# Patient Record
Sex: Female | Born: 1970
Health system: Southern US, Community
[De-identification: ages and names within clinical notes are randomized; demographics above are authoritative.]

## PROBLEM LIST (undated history)

## (undated) DIAGNOSIS — K5792 Diverticulitis of intestine, part unspecified, without perforation or abscess without bleeding: Secondary | ICD-10-CM

## (undated) DIAGNOSIS — K5909 Other constipation: Secondary | ICD-10-CM

## (undated) DIAGNOSIS — Z86018 Personal history of other benign neoplasm: Secondary | ICD-10-CM

## (undated) HISTORY — DX: Other constipation: K59.09

## (undated) HISTORY — DX: Personal history of other benign neoplasm: Z86.018

## (undated) HISTORY — DX: Diverticulitis of intestine, part unspecified, without perforation or abscess without bleeding: K57.92

---

## 2006-09-28 HISTORY — PX: LAPAROSCOPIC SUPRACERVICAL HYSTERECTOMY: SUR797

## 2006-09-28 HISTORY — PX: ABDOMINAL HYSTERECTOMY: SHX81

## 2006-11-24 ENCOUNTER — Emergency Department: Payer: Self-pay | Admitting: Emergency Medicine

## 2006-11-24 ENCOUNTER — Other Ambulatory Visit: Payer: Self-pay

## 2006-11-29 ENCOUNTER — Ambulatory Visit: Payer: Self-pay | Admitting: Emergency Medicine

## 2007-04-18 ENCOUNTER — Ambulatory Visit: Payer: Self-pay | Admitting: Unknown Physician Specialty

## 2007-04-26 ENCOUNTER — Inpatient Hospital Stay: Payer: Self-pay | Admitting: Unknown Physician Specialty

## 2008-11-09 ENCOUNTER — Ambulatory Visit: Payer: Self-pay | Admitting: Family Medicine

## 2008-11-14 ENCOUNTER — Ambulatory Visit: Payer: Self-pay | Admitting: Family Medicine

## 2008-11-22 ENCOUNTER — Ambulatory Visit: Payer: Self-pay | Admitting: Family Medicine

## 2009-07-31 ENCOUNTER — Other Ambulatory Visit: Payer: Self-pay | Admitting: Family Medicine

## 2009-08-28 ENCOUNTER — Ambulatory Visit: Payer: Self-pay | Admitting: Gastroenterology

## 2009-12-29 ENCOUNTER — Emergency Department: Payer: Self-pay | Admitting: Emergency Medicine

## 2012-07-28 ENCOUNTER — Ambulatory Visit: Payer: Self-pay | Admitting: Family Medicine

## 2013-08-02 ENCOUNTER — Ambulatory Visit: Payer: Self-pay | Admitting: Family Medicine

## 2013-08-26 ENCOUNTER — Emergency Department: Payer: Self-pay | Admitting: Emergency Medicine

## 2013-08-26 LAB — COMPREHENSIVE METABOLIC PANEL WITH GFR
Albumin: 3.8 g/dL
Alkaline Phosphatase: 87 U/L
Anion Gap: 4 — ABNORMAL LOW
BUN: 12 mg/dL
Bilirubin,Total: 0.3 mg/dL
Calcium, Total: 8.9 mg/dL
Chloride: 110 mmol/L — ABNORMAL HIGH
Co2: 26 mmol/L
Creatinine: 0.82 mg/dL
EGFR (African American): >60
EGFR (Non-African Amer.): >60
Glucose: 96 mg/dL
Osmolality: 279
Potassium: 3.8 mmol/L
SGOT(AST): 25 U/L
SGPT (ALT): 24 U/L
Sodium: 140 mmol/L
Total Protein: 7.3 g/dL

## 2013-08-26 LAB — CBC WITH DIFFERENTIAL/PLATELET
Basophil #: 0.1 10*3/uL (ref 0.0–0.1)
Eosinophil #: 0.6 10*3/uL (ref 0.0–0.7)
HGB: 13.6 g/dL (ref 12.0–16.0)
Lymphocyte #: 1.8 10*3/uL (ref 1.0–3.6)
Lymphocyte %: 30.9 %
MCH: 29.8 pg (ref 26.0–34.0)
Monocyte #: 0.6 x10 3/mm (ref 0.2–0.9)
Monocyte %: 9.7 %
Neutrophil %: 48.8 %
Platelet: 184 10*3/uL (ref 150–440)

## 2013-08-26 LAB — URINALYSIS, COMPLETE
Bacteria: NONE SEEN
Bilirubin,UR: NEGATIVE
Glucose,UR: NEGATIVE mg/dL
Ketone: NEGATIVE
Leukocyte Esterase: NEGATIVE
Nitrite: NEGATIVE
Ph: 7
Protein: NEGATIVE
RBC,UR: 2 /HPF
Specific Gravity: 1.021
Squamous Epithelial: 1
WBC UR: 1 /HPF

## 2013-08-26 LAB — TROPONIN I: Troponin-I: <0.02 ng/mL

## 2013-08-26 LAB — LIPASE, BLOOD: Lipase: 227 U/L (ref 73–393)

## 2014-06-08 ENCOUNTER — Ambulatory Visit: Payer: Self-pay | Admitting: Family Medicine

## 2014-08-06 ENCOUNTER — Ambulatory Visit: Payer: Self-pay | Admitting: Family Medicine

## 2014-08-06 LAB — HM MAMMOGRAPHY: HM Mammogram: NORMAL

## 2015-01-10 ENCOUNTER — Encounter: Payer: Self-pay | Admitting: General Surgery

## 2015-01-10 ENCOUNTER — Ambulatory Visit (INDEPENDENT_AMBULATORY_CARE_PROVIDER_SITE_OTHER): Payer: 59 | Admitting: General Surgery

## 2015-01-10 VITALS — BP 120/80 | HR 80 | Resp 12 | Ht 67.0 in | Wt 147.0 lb

## 2015-01-10 DIAGNOSIS — N644 Mastodynia: Secondary | ICD-10-CM

## 2015-01-10 NOTE — Progress Notes (Signed)
Patient ID: Donna GreenhouseKimberly Siegman, female   DOB: August 27, 1971, 44 y.o.   MRN: [id number]  Chief Complaint  Patient presents with  . Other    right breast mass    HPI Donna GreenhouseKimberly Economos is a 44 y.o. female. who presents for a breast evaluation. The most recent  mammogram and ultrasound was done on 06/14/14 and in November 2015. Negative finding. Patient states she went for her regular check up and Dr. Carlynn PurlSowles felt a lump om her right breast 12 o'clock. No pain noticed Patient does perform regular self breast checks and gets regular mammograms done.    HPI  Past Medical History  Diagnosis Date  . Diverticulitis     Past Surgical History  Procedure Laterality Date  . Abdominal hysterectomy      History reviewed. No pertinent family history.  Social History History  Substance Use Topics  . Smoking status: Never Smoker   . Smokeless tobacco: Not on file  . Alcohol Use: No    No Known Allergies  No current outpatient prescriptions on file.   No current facility-administered medications for this visit.    Review of Systems Review of Systems  Constitutional: Negative.   Respiratory: Negative.   Cardiovascular: Negative.     Blood pressure 120/80, pulse 80, resp. rate 12, height 5\' 7"  (1.702 m), weight 147 lb (66.679 kg).  Physical Exam Physical Exam  Constitutional: She is oriented to person, place, and time. She appears well-developed and well-nourished.  Eyes: Conjunctivae are normal. No scleral icterus.  Neck: Neck supple.  Cardiovascular: Normal rate, regular rhythm and normal heart sounds.   Pulmonary/Chest: Effort normal and breath sounds normal. Right breast exhibits no inverted nipple, no mass, no nipple discharge, no skin change and no tenderness. Left breast exhibits no inverted nipple, no mass, no nipple discharge, no skin change and no tenderness.  Right breast firmness in the upper outer quadrant. Similar but less marked firmness on left breast uoq.   Abdominal:  Soft. Normal appearance and bowel sounds are normal. There is no hepatomegaly. There is no tenderness.  Lymphadenopathy:    She has no cervical adenopathy.    She has no axillary adenopathy.  Neurological: She is alert and oriented to person, place, and time.  Skin: Skin is warm and dry.    Data Reviewed Mammogram and ultrasound reviewed  Assessment     Findings are benign. Pt reassured.    Plan    Patient to return as needed.  Continue self breast exams. Call office for any new breast issues or concerns. Follow up with PCP for regular mammogram.       PCP:  Charlott HollerSowles, Krichna  SANKAR,SEEPLAPUTHUR G 01/10/2015, 12:06 PM

## 2015-01-10 NOTE — Patient Instructions (Signed)
Patient to return in one  Month. Continue self breast exams. Call office for any new breast issues or concerns.

## 2015-05-16 ENCOUNTER — Telehealth: Payer: Self-pay | Admitting: Family Medicine

## 2015-05-16 NOTE — Telephone Encounter (Signed)
Patient is requesting a emergency prescription of cipro and flagyl. She is currently out of town and is having a diverticulitis flare up. Please send to [facility] on [address] in [place]

## 2015-05-16 NOTE — Telephone Encounter (Signed)
Patient notified

## 2015-05-16 NOTE — Telephone Encounter (Signed)
She needs to go to local urgent care, I am sorry

## 2015-06-17 ENCOUNTER — Encounter: Payer: Self-pay | Admitting: Family Medicine

## 2015-06-17 DIAGNOSIS — R51 Headache: Secondary | ICD-10-CM

## 2015-06-17 DIAGNOSIS — K5909 Other constipation: Secondary | ICD-10-CM | POA: Insufficient documentation

## 2015-06-17 DIAGNOSIS — R519 Headache, unspecified: Secondary | ICD-10-CM | POA: Insufficient documentation

## 2015-06-17 DIAGNOSIS — N63 Unspecified lump in unspecified breast: Secondary | ICD-10-CM | POA: Insufficient documentation

## 2015-06-18 ENCOUNTER — Encounter: Payer: Self-pay | Admitting: Family Medicine

## 2015-06-18 ENCOUNTER — Other Ambulatory Visit: Payer: Self-pay | Admitting: Family Medicine

## 2015-06-18 ENCOUNTER — Ambulatory Visit (INDEPENDENT_AMBULATORY_CARE_PROVIDER_SITE_OTHER): Payer: 59 | Admitting: Family Medicine

## 2015-06-18 VITALS — BP 114/66 | HR 86 | Temp 98.6°F | Resp 14 | Ht 67.0 in | Wt 146.3 lb

## 2015-06-18 DIAGNOSIS — Z23 Encounter for immunization: Secondary | ICD-10-CM | POA: Diagnosis not present

## 2015-06-18 DIAGNOSIS — Z90711 Acquired absence of uterus with remaining cervical stump: Secondary | ICD-10-CM | POA: Insufficient documentation

## 2015-06-18 DIAGNOSIS — Z131 Encounter for screening for diabetes mellitus: Secondary | ICD-10-CM

## 2015-06-18 DIAGNOSIS — Z13220 Encounter for screening for lipoid disorders: Secondary | ICD-10-CM

## 2015-06-18 DIAGNOSIS — Z7189 Other specified counseling: Secondary | ICD-10-CM

## 2015-06-18 DIAGNOSIS — N63 Unspecified lump in unspecified breast: Secondary | ICD-10-CM

## 2015-06-18 DIAGNOSIS — Z90710 Acquired absence of both cervix and uterus: Secondary | ICD-10-CM

## 2015-06-18 DIAGNOSIS — K5790 Diverticulosis of intestine, part unspecified, without perforation or abscess without bleeding: Secondary | ICD-10-CM | POA: Insufficient documentation

## 2015-06-18 DIAGNOSIS — Z1239 Encounter for other screening for malignant neoplasm of breast: Secondary | ICD-10-CM

## 2015-06-18 DIAGNOSIS — Z0000 Encounter for general adult medical examination without abnormal findings: Secondary | ICD-10-CM

## 2015-06-18 DIAGNOSIS — Z01419 Encounter for gynecological examination (general) (routine) without abnormal findings: Secondary | ICD-10-CM

## 2015-06-18 DIAGNOSIS — Z124 Encounter for screening for malignant neoplasm of cervix: Secondary | ICD-10-CM | POA: Diagnosis not present

## 2015-06-18 DIAGNOSIS — Z719 Counseling, unspecified: Secondary | ICD-10-CM

## 2015-06-18 NOTE — Progress Notes (Signed)
Name: Donna Wyatt   MRN: [id number]    DOB: 1971-05-09   Date:06/18/2015       Progress Note  Subjective  Chief Complaint  Chief Complaint  Patient presents with  . Annual Exam    HPI  Well Woman Exam: she has been feeling fine, no complaints.  She has not been physically active lately but eats healthy meals - usually at home.    Patient Active Problem List   Diagnosis Date Noted  . Diverticulosis 06/18/2015  . History of hysterectomy 06/18/2015  . Breast lump in female 06/17/2015  . Chronic constipation 06/17/2015  . Cephalalgia 06/17/2015    Past Surgical History  Procedure Laterality Date  . Abdominal hysterectomy      Family History  Problem Relation Age of Onset  . Diabetes Mother   . Hyperlipidemia Father   . Hypertension Father   . Glaucoma Father   . Hyperlipidemia Sister   . Hypertension Sister   . Asthma Sister   . Fibroids Sister     Social History   Social History  . Marital Status: Married    Spouse Name: N/A  . Number of Children: N/A  . Years of Education: N/A   Occupational History  . Not on file.   Social History Main Topics  . Smoking status: Never Smoker   . Smokeless tobacco: Never Used  . Alcohol Use: 0.0 oz/week    0 Standard drinks or equivalent per week  . Drug Use: No  . Sexual Activity: Yes   Other Topics Concern  . Not on file   Social History Narrative    No current outpatient prescriptions on file.  Allergies  Allergen Reactions  . Flu Virus Vaccine     localized reaction right arm also had N/V and body aches     ROS  Constitutional: Negative for fever or weight change.  Respiratory: Negative for cough and shortness of breath.   Cardiovascular: Negative for chest pain or palpitations.  Gastrointestinal: Negative for abdominal pain, no bowel changes.  Musculoskeletal: Negative for gait problem or joint swelling.  Skin: Negative for rash.  Neurological: Negative for dizziness or headache.  No other  specific complaints in a complete review of systems (except as listed in HPI above).  Objective  Filed Vitals:   06/18/15 0937  BP: 114/66  Pulse: 86  Temp: 98.6 F (37 C)  TempSrc: Oral  Resp: 14  Height:  (1.702 m)  Weight: 146 lb 4.8 oz (66.361 kg)  SpO2: 98%    Body mass index is 22.91 kg/(m^2).  Physical Exam  Constitutional: Patient appears well-developed and well-nourished. No distress.  HENT: Head: Normocephalic and atraumatic. Ears: B TMs ok, no erythema or effusion; Nose: Nose normal. Mouth/Throat: Oropharynx is clear and moist. No oropharyngeal exudate.  Eyes: Conjunctivae and EOM are normal. Pupils are equal, round, and reactive to light. No scleral icterus.  Neck: Normal range of motion. Neck supple. No JVD present. No thyromegaly present.  Cardiovascular: Normal rate, regular rhythm and normal heart sounds.  No murmur heard. No BLE edema. Pulmonary/Chest: Effort normal and breath sounds normal. No respiratory distress. Abdominal: Soft. Bowel sounds are normal, no distension. There is no tenderness. no masses Breast: right breast has a large mass at 12 o'clock - stable and seen by Dr. Evette Cristal - called [facility] and advised to only do  no nipple discharge or rashes FEMALE GENITALIA:   External genitalia normal External urethra normal Vaginal vault normal without discharge or lesions  Cervix normal without discharge or lesions Bimanual exam normal , no uterus, ovaries not felt RECTAL: not done Musculoskeletal: Normal range of motion, no joint effusions. No gross deformities Neurological: he is alert and oriented to person, place, and time. No cranial nerve deficit. Coordination, balance, strength, speech and gait are normal.  Skin: Skin is warm and dry. No rash noted. No erythema.  Psychiatric: Patient has a normal mood and affect. behavior is normal. Judgment and thought content normal.  PHQ2/9: Depression screen PHQ 2/9 06/18/2015  Decreased Interest 0  Down,  Depressed, Hopeless 0  PHQ - 2 Score 0     Fall Risk: Fall Risk  06/18/2015  Falls in the past year? No      Functional Status Survey: Is the patient deaf or have difficulty hearing?: No Does the patient have difficulty seeing, even when wearing glasses/contacts?: No Does the patient have difficulty concentrating, remembering, or making decisions?: No Does the patient have difficulty walking or climbing stairs?: No Does the patient have difficulty dressing or bathing?: No Does the patient have difficulty doing errands alone such as visiting a doctor's office or shopping?: No    Assessment & Plan  1. Well woman exam   2. Needs flu shot  - Flu Vaccine QUAD 36+ mos PF IM (Fluarix & Fluzone Quad PF)  3. Breast lump in female Stable, seen by Dr. Evette Cristal, continue yearly mammogram   4. Health counseling Discussed importance of 150 minutes of physical activity weekly, eat two servings of fish weekly, eat one serving of tree nuts ( cashews, pistachios, pecans, almonds.Marland Kitchen) every other day, eat 6 servings of fruit/vegetables daily and drink plenty of water and avoid sweet beverages.   5. History of hysterectomy Supracervical   6. Lipid screening  - Lipid panel  7. Diabetes mellitus screening  - Glucose  8. Breast cancer screening  - MM Digital Screening; Future

## 2015-06-24 LAB — PAPLB, HPV, RFX16/18: PAP SMEAR COMMENT: 0

## 2015-06-26 NOTE — Progress Notes (Signed)
Patient notified

## 2015-08-28 ENCOUNTER — Other Ambulatory Visit: Payer: Self-pay | Admitting: Family Medicine

## 2015-08-28 ENCOUNTER — Ambulatory Visit
Admission: RE | Admit: 2015-08-28 | Discharge: 2015-08-28 | Disposition: A | Discharge status: Home / Self Care | Payer: 59 | Source: Ambulatory Visit | Attending: Family Medicine | Admitting: Family Medicine

## 2015-08-28 DIAGNOSIS — Z1239 Encounter for other screening for malignant neoplasm of breast: Secondary | ICD-10-CM

## 2015-08-28 DIAGNOSIS — Z1231 Encounter for screening mammogram for malignant neoplasm of breast: Secondary | ICD-10-CM | POA: Diagnosis not present

## 2016-03-26 DIAGNOSIS — Z202 Contact with and (suspected) exposure to infections with a predominantly sexual mode of transmission: Secondary | ICD-10-CM | POA: Diagnosis not present

## 2016-03-26 DIAGNOSIS — N898 Other specified noninflammatory disorders of vagina: Secondary | ICD-10-CM | POA: Diagnosis not present

## 2016-04-29 ENCOUNTER — Ambulatory Visit
Admission: EM | Admit: 2016-04-29 | Discharge: 2016-04-29 | Disposition: A | Discharge status: Home / Self Care | Payer: 59 | Attending: Emergency Medicine | Admitting: Emergency Medicine

## 2016-04-29 ENCOUNTER — Encounter: Payer: Self-pay | Admitting: *Deleted

## 2016-04-29 DIAGNOSIS — K5733 Diverticulitis of large intestine without perforation or abscess with bleeding: Secondary | ICD-10-CM | POA: Diagnosis not present

## 2016-04-29 LAB — BASIC METABOLIC PANEL
Anion gap: 5 (ref 5–15)
BUN: 15 mg/dL (ref 6–20)
CHLORIDE: 108 mmol/L (ref 101–111)
CO2: 25 mmol/L (ref 22–32)
CREATININE: 0.71 mg/dL (ref 0.44–1.00)
Calcium: 9.1 mg/dL (ref 8.9–10.3)
GFR calc Af Amer: >60 mL/min (ref >60)
GFR calc non Af Amer: >60 mL/min (ref >60)
GLUCOSE: 94 mg/dL (ref 65–99)
POTASSIUM: 3.8 mmol/L (ref 3.5–5.1)
Sodium: 138 mmol/L (ref 135–145)

## 2016-04-29 LAB — CBC WITH DIFFERENTIAL/PLATELET
Basophils Absolute: 0.1 10*3/uL (ref 0–0.1)
Basophils Relative: 1 %
EOS ABS: 0.4 10*3/uL (ref 0–0.7)
Eosinophils Relative: 7 %
HCT: 41.2 % (ref 35.0–47.0)
HEMOGLOBIN: 13.6 g/dL (ref 12.0–16.0)
LYMPHS ABS: 2.2 10*3/uL (ref 1.0–3.6)
LYMPHS PCT: 35 %
MCH: 29.2 pg (ref 26.0–34.0)
MCHC: 33 g/dL (ref 32.0–36.0)
MCV: 88.5 fL (ref 80.0–100.0)
MONOS PCT: 8 %
Monocytes Absolute: 0.5 10*3/uL (ref 0.2–0.9)
NEUTROS PCT: 49 %
Neutro Abs: 3.2 10*3/uL (ref 1.4–6.5)
Platelets: 216 10*3/uL (ref 150–440)
RBC: 4.66 MIL/uL (ref 3.80–5.20)
RDW: 12.7 % (ref 11.5–14.5)
WBC: 6.4 10*3/uL (ref 3.6–11.0)

## 2016-04-29 LAB — OCCULT BLOOD X 1 CARD TO LAB, STOOL: Fecal Occult Bld: POSITIVE — AB

## 2016-04-29 MED ORDER — METRONIDAZOLE 500 MG PO TABS: 500.0000 mg | ORAL_TABLET | Freq: Three times a day (TID) | ORAL | 0 refills | Status: DC

## 2016-04-29 MED ORDER — CIPROFLOXACIN HCL 500 MG PO TABS: 500.0000 mg | ORAL_TABLET | Freq: Two times a day (BID) | ORAL | 0 refills | Status: DC

## 2016-04-29 NOTE — ED Notes (Signed)
Rectal exam and sample collected by Dr. Chaney Malling.

## 2016-04-29 NOTE — ED Triage Notes (Signed)
Patient started having bloody stools today. Patient does have a history of diverticulitis.  Patient also states that she has had large stools recently.

## 2016-04-29 NOTE — ED Provider Notes (Signed)
HPI  SUBJECTIVE:  Donna Wyatt is a 45 y.o. female who presents with mild constant, dull left lower quadrant discomfort and blood-streaked stools today. She states that it was bright red. She has had 3 episodes of blood in her stool over the past 3 months. Had one episode today when stooling.  has  had one bowel movement today. She states that previously the blood in her stool was accompanying with left lower quadrant pain which was diagnosed as diverticulitis. She states that today he does not feel like there is a full one flare, but she does have some discomfort in the LLQ. There are no aggravating or alleviating factors. She has not tried anything for this. She denies passing clots, black or tarry stools, melena, frank hematochezia. No dripping of blood into the toilet. She denies fevers, nausea, vomiting, anorexia, chest pain, shortness of breath, lightheadedness, palpitations, presyncope, syncope. She denies abdominal distention, diarrhea, back pain, vaginal bleeding. No urinary complaints, hematuria. No epistaxis. No change in her diet recently. Abdominal pain is not associated with movement, eating, urination, defecation. She has been treated for this in the past as a uncompliocated Diverticulitis with ibuprofen, Cipro and Flagyl. She has never had a colonoscopy. She has a past medical history of diverticulitis usually managed through diet and medical management, and negative for hemorrhoids, anticoagulant antiplatelet use, diabetes, hypertension, afib. No constipation, coagulopathies, thrombocytopenia, NSAID use, peptic ulcer disease, GI bleed, gastric ulcers, ulcerative colitis. Family history negative for GI cancer or colon cancer. PMD: Dr. Carlynn Purl. LMP: Hysterectomy.   Past Medical History:  Diagnosis Date  . Chronic constipation   . Diverticulitis   . History of uterine fibroid     Past Surgical History:  Procedure Laterality Date  . ABDOMINAL HYSTERECTOMY      Family History   Problem Relation Age of Onset  . Diabetes Mother   . Hyperlipidemia Father   . Hypertension Father   . Glaucoma Father   . Hyperlipidemia Sister   . Hypertension Sister   . Asthma Sister   . Fibroids Sister     Social History  Substance Use Topics  . Smoking status: Never Smoker  . Smokeless tobacco: Never Used  . Alcohol use 0.0 oz/week    No current facility-administered medications for this encounter.   Current Outpatient Prescriptions:  .  ciprofloxacin (CIPRO) 500 MG tablet, Take 1 tablet (500 mg total) by mouth 2 (two) times daily., Disp: 14 tablet, Rfl: 0 .  metroNIDAZOLE (FLAGYL) 500 MG tablet, Take 1 tablet (500 mg total) by mouth 3 (three) times daily., Disp: 21 tablet, Rfl: 0  Allergies  Allergen Reactions  . Flu Virus Vaccine     localized reaction right arm also had N/V and body aches     ROS  As noted in HPI.   Physical Exam  BP 136/78 (BP Location: Left Arm)   Pulse 73   Temp 98.5 F (36.9 C) (Oral)   Resp 18   Ht  (1.702 m)   Wt 150 lb (68 kg)   SpO2 100%   BMI 23.49 kg/m   Constitutional: Well developed, well nourished, no acute distressMoving around comfortably Eyes:  EOMI, conjunctiva normal bilaterally HENT: Normocephalic, atraumatic,mucus membranes moist Respiratory: Normal inspiratory effort Cardiovascular: Normal rate GI: nondistended. Normal appearance. Healed transverse lower abdominal surgical scar. Left lower quadrant tenderness with lying down, no rebound or guarding. No left lower quadrant tenderness with patient standing up. No flank tenderness. No suprapubic tenderness. Normal bowel sounds. Negative Eulah Pont,  negative McBurney.  Back: No CVA tenderness Rectal: No fissures, external hemorrhoids. Mucoid pinkish material on the glove. No gross blood or melena. Hemoccult sent. Chaperone present during exam skin: No rash, skin intact Musculoskeletal: no deformities Neurologic: Alert & oriented x 3, no focal neuro  deficits Psychiatric: Speech and behavior appropriate   ED Course   Medications - No data to display  Orders Placed This Encounter  Procedures  . Occult blood card to lab, stool Provider will collect    Standing Status:   Standing    Number of Occurrences:   1    Order Specific Question:   Specimen to be collected by?    Answer:   Provider will collect  . CBC with Differential    Standing Status:   Standing    Number of Occurrences:   1  . Basic metabolic panel    Standing Status:   Standing    Number of Occurrences:   1  . Ambulatory referral to Gastroenterology    Referral Priority:   Urgent    Referral Type:   Consultation    Referral Reason:   Specialty Services Required    Number of Visits Requested:   1    Results for orders placed or performed during the hospital encounter of 04/29/16 (from the past 24 hour(s))  Occult blood card to lab, stool Provider will collect     Status: Abnormal   Collection Time: 04/29/16  5:35 PM  Result Value Ref Range   Fecal Occult Bld POSITIVE (A) NEGATIVE  CBC with Differential     Status: None   Collection Time: 04/29/16  5:50 PM  Result Value Ref Range   WBC 6.4 3.6 - 11.0 K/uL   RBC 4.66 3.80 - 5.20 MIL/uL   Hemoglobin 13.6 12.0 - 16.0 g/dL   HCT 16.1 09.6 - 04.5 %   MCV 88.5 80.0 - 100.0 fL   MCH 29.2 26.0 - 34.0 pg   MCHC 33.0 32.0 - 36.0 g/dL   RDW 40.9 81.1 - 91.4 %   Platelets 216 150 - 440 K/uL   Neutrophils Relative % 49 %   Neutro Abs 3.2 1.4 - 6.5 K/uL   Lymphocytes Relative 35 %   Lymphs Abs 2.2 1.0 - 3.6 K/uL   Monocytes Relative 8 %   Monocytes Absolute 0.5 0.2 - 0.9 K/uL   Eosinophils Relative 7 %   Eosinophils Absolute 0.4 0 - 0.7 K/uL   Basophils Relative 1 %   Basophils Absolute 0.1 0 - 0.1 K/uL  Basic metabolic panel     Status: None   Collection Time: 04/29/16  5:50 PM  Result Value Ref Range   Sodium 138 135 - 145 mmol/L   Potassium 3.8 3.5 - 5.1 mmol/L   Chloride 108 101 - 111 mmol/L   CO2 25 22  - 32 mmol/L   Glucose, Bld 94 65 - 99 mg/dL   BUN 15 6 - 20 mg/dL   Creatinine, Ser 7.82 0.44 - 1.00 mg/dL   Calcium 9.1 8.9 - 95.6 mg/dL   GFR calc non Af Amer >60 >60 mL/min   GFR calc Af Amer >60 >60 mL/min   Anion gap 5 5 - 15   No results found.  ED Clinical Impression  Diverticulitis of large intestine without perforation or abscess with bleeding   ED Assessment/Plan  Reviewed labs independently. hemoocult positive.  Hemoglobin is the same from 2014. No leukocytosis. Normal BMP.  H&P  is most consistent  with uncomplicated diverticulitis. She is nontoxic appearing. Abdomen very benign. No evidence of perforation, obstruction or ischemic colitis. Doubt GU or GYN cause of her symptoms. Normal vitals, normal labs. She does not have any other comorbidities, feel that patient is stable to treat as an outpatient. She states that she will take 800 mg ibuprofen with 1 g of Tylenol 3 times a day, states that she does not need a prescription of IBU. Will send home with Cipro 500 mg twice a day and Flagyl 500 mg every 8 for 10 days. Giving patient strict ER abdominal pain return precautions. Ordering GI follow-up.   Discussed labs,MDM, plan and followup with patient . Discussed sn/sx that should prompt return to the ED. Patient agrees with plan.   *This clinic note was created using Dragon dictation software. Therefore, there may be occasional mistakes despite careful proofreading.  ?   Domenick Gong, MD 04/29/16 [phone]

## 2016-04-29 NOTE — Discharge Instructions (Signed)
800 mg ibuprofen with 1 g of Tylenol 3 times a day. Follow-up with Dr. Marva Panda GI on-call. Go to the ER for the signs and symptoms we discussed

## 2016-04-30 ENCOUNTER — Ambulatory Visit: Payer: 59 | Admitting: Family Medicine

## 2016-07-13 ENCOUNTER — Other Ambulatory Visit: Payer: Self-pay | Admitting: Family Medicine

## 2016-07-28 ENCOUNTER — Encounter: Payer: Self-pay | Admitting: Family Medicine

## 2016-07-28 ENCOUNTER — Ambulatory Visit (INDEPENDENT_AMBULATORY_CARE_PROVIDER_SITE_OTHER): Payer: 59 | Admitting: Family Medicine

## 2016-07-28 VITALS — BP 104/62 | HR 92 | Temp 98.3°F | Resp 18 | Ht 67.0 in | Wt 154.7 lb

## 2016-07-28 DIAGNOSIS — K5730 Diverticulosis of large intestine without perforation or abscess without bleeding: Secondary | ICD-10-CM

## 2016-07-28 DIAGNOSIS — Z8719 Personal history of other diseases of the digestive system: Secondary | ICD-10-CM

## 2016-07-28 DIAGNOSIS — N644 Mastodynia: Secondary | ICD-10-CM

## 2016-07-28 NOTE — Progress Notes (Signed)
Name: Donna Wyatt   MRN: [id number]    DOB: [birth year]   Date:07/28/2016       Progress Note  Subjective  Chief Complaint  Chief Complaint  Patient presents with  . Breast Pain    across right breast intermittent pain for two to three days     HPI  Right breast pain: she noticed right breast pain, described as sharp pain, intermittent that lasted a few days and resolved. She had a diagnostic US and mammogram of right breast in 2015, normal in 2016. She is concerned because she felt a lump on right breast  Diverticulitis: she had 3 severe episodes of diverticulitis, last one was two months ago, she went to Urgent Care and took Cipro and Metronidazole, she had one episode of bloody stools, pain has resolved. She never had a colonoscopy, No longer having problems with constipation   Patient Active Problem List   Diagnosis Date Noted  . Diverticulosis 06/18/2015  . History of hysterectomy, supracervical 06/18/2015  . Breast lump in female 06/17/2015  . Chronic constipation 06/17/2015  . Cephalalgia 06/17/2015    Past Surgical History:  Procedure Laterality Date  . ABDOMINAL HYSTERECTOMY      Family History  Problem Relation Age of Onset  . Diabetes Mother   . Hyperlipidemia Father   . Hypertension Father   . Glaucoma Father   . Hyperlipidemia Sister   . Hypertension Sister   . Asthma Sister   . Fibroids Sister     Social History   Social History  . Marital status: Married    Spouse name: N/A  . Number of children: N/A  . Years of education: N/A   Occupational History  . Not on file.   Social History Main Topics  . Smoking status: Never Smoker  . Smokeless tobacco: Never Used  . Alcohol use 0.0 oz/week  . Drug use: No  . Sexual activity: Yes   Other Topics Concern  . Not on file   Social History Narrative  . No narrative on file    No current outpatient prescriptions on file.  No Known Allergies   ROS  Ten systems reviewed and is negative  except as mentioned in HPI   Objective  Vitals:   07/28/16 0926  BP: 104/62  Pulse: 92  Resp: 18  Temp: 98.3 F (36.8 C)  SpO2: 98%  Weight: 154 lb 11.2 oz (70.2 kg)  Height: _0  (1.702 m)    Body mass index is 24.23 kg/m.  Physical Exam  Constitutional: Patient appears well-developed and well-nourished. No distress.  HEENT: head atraumatic, normocephalic, pupils equal and reactive to light, , neck supple, throat within normal limits Cardiovascular: Normal rate, regular rhythm and normal heart sounds.  No murmur heard. No BLE edema. Pulmonary/Chest: Effort normal and breath sounds normal. No respiratory distress. Abdominal: Soft.  There is no tenderness. Psychiatric: Patient has a normal mood and affect. behavior is normal. Judgment and thought content normal. Breast: right upper breast had a large mass, possible breast lump. No tenderness, normal axillary exam  Recent Results (from the past 2160 hour(s))  Occult blood card to lab, stool Provider will collect     Status: Abnormal   Collection Time: 04/29/16  5:35 PM  Result Value Ref Range   Fecal Occult Bld POSITIVE (A) NEGATIVE  CBC with Differential     Status: None   Collection Time: 04/29/16  5:50 PM  Result Value Ref Range   WBC 6.4 3.6 - 11.0  K/uL   RBC 4.66 3.80 - 5.20 MIL/uL   Hemoglobin 13.6 12.0 - 16.0 g/dL   HCT 41.2 35.0 - 47.0 %   MCV 88.5 80.0 - 100.0 fL   MCH 29.2 26.0 - 34.0 pg   MCHC 33.0 32.0 - 36.0 g/dL   RDW 12.7 11.5 - 14.5 %   Platelets 216 150 - 440 K/uL   Neutrophils Relative % 49 %   Neutro Abs 3.2 1.4 - 6.5 K/uL   Lymphocytes Relative 35 %   Lymphs Abs 2.2 1.0 - 3.6 K/uL   Monocytes Relative 8 %   Monocytes Absolute 0.5 0.2 - 0.9 K/uL   Eosinophils Relative 7 %   Eosinophils Absolute 0.4 0 - 0.7 K/uL   Basophils Relative 1 %   Basophils Absolute 0.1 0 - 0.1 K/uL  Basic metabolic panel     Status: None   Collection Time: 04/29/16  5:50 PM  Result Value Ref Range   Sodium 138 135 -  145 mmol/L   Potassium 3.8 3.5 - 5.1 mmol/L   Chloride 108 101 - 111 mmol/L   CO2 25 22 - 32 mmol/L   Glucose, Bld 94 65 - 99 mg/dL   BUN 15 6 - 20 mg/dL   Creatinine, Ser 0.71 0.44 - 1.00 mg/dL   Calcium 9.1 8.9 - 10.3 mg/dL   GFR calc non Af Amer >60 >60 mL/min   GFR calc Af Amer >60 >60 mL/min    Comment: (NOTE) The eGFR has been calculated using the CKD EPI equation. This calculation has not been validated in all clinical situations. eGFR's persistently <60 mL/min signify possible Chronic Kidney Disease.    Anion gap 5 5 - 15      PHQ2/9: Depression screen [facility] 2/9 07/28/2016 06/18/2015  Decreased Interest 0 0  Down, Depressed, Hopeless 0 0  PHQ - 2 Score 0 0     Fall Risk: Fall Risk  07/28/2016 06/18/2015  Falls in the past year? No No    Functional Status Survey: Is the patient deaf or have difficulty hearing?: No Does the patient have difficulty seeing, even when wearing glasses/contacts?: No Does the patient have difficulty concentrating, remembering, or making decisions?: No Does the patient have difficulty walking or climbing stairs?: No Does the patient have difficulty dressing or bathing?: No Does the patient have difficulty doing errands alone such as visiting a doctor's office or shopping?: No    Assessment & Plan    1. Breast pain in female  - Ambulatory referral to General Surgery - MM Digital Diagnostic Unilat R; Future - MM Digital Diagnostic Bilat; Future - US BREAST COMPLETE UNI RIGHT INC AXILLA; Future  2. Diverticulosis of sigmoid colon  - Ambulatory referral to General Surgery  3. History of diverticulitis  - Ambulatory referral to General Surgery

## 2016-07-28 NOTE — Addendum Note (Signed)
Addended by: Cynda FamiliaJOHNSON, Vernie Vinciguerra L on: 07/28/2016 10:39 AM   Modules accepted: Orders

## 2016-08-06 ENCOUNTER — Encounter: Payer: Self-pay | Admitting: *Deleted

## 2016-08-11 ENCOUNTER — Ambulatory Visit: Payer: 59 | Admitting: General Surgery

## 2016-08-17 ENCOUNTER — Ambulatory Visit: Payer: 59

## 2016-08-17 ENCOUNTER — Other Ambulatory Visit: Payer: 59

## 2016-08-17 LAB — HM MAMMOGRAPHY

## 2016-08-18 ENCOUNTER — Other Ambulatory Visit: Payer: Self-pay | Admitting: Family Medicine

## 2016-08-18 ENCOUNTER — Ambulatory Visit
Admission: RE | Admit: 2016-08-18 | Discharge: 2016-08-18 | Disposition: A | Discharge status: Home / Self Care | Payer: 59 | Source: Ambulatory Visit | Attending: Family Medicine | Admitting: Family Medicine

## 2016-08-18 DIAGNOSIS — N644 Mastodynia: Secondary | ICD-10-CM

## 2016-08-19 ENCOUNTER — Encounter: Payer: Self-pay | Admitting: Family Medicine

## 2016-09-24 ENCOUNTER — Encounter: Payer: Self-pay | Admitting: *Deleted

## 2017-07-19 ENCOUNTER — Other Ambulatory Visit: Payer: Self-pay | Admitting: Family Medicine

## 2017-07-19 DIAGNOSIS — Z1231 Encounter for screening mammogram for malignant neoplasm of breast: Secondary | ICD-10-CM

## 2017-09-01 ENCOUNTER — Ambulatory Visit: Payer: Self-pay | Admitting: *Deleted

## 2017-09-01 ENCOUNTER — Encounter: Payer: Self-pay | Admitting: Family Medicine

## 2017-09-01 ENCOUNTER — Emergency Department: Admission: EM | Admit: 2017-09-01 | Discharge: 2017-09-01 | Discharge status: Against Medical Advice | Payer: 59

## 2017-09-01 ENCOUNTER — Ambulatory Visit (INDEPENDENT_AMBULATORY_CARE_PROVIDER_SITE_OTHER): Payer: 59 | Admitting: Family Medicine

## 2017-09-01 VITALS — BP 118/82 | HR 73 | Temp 97.9°F | Resp 16 | Ht 67.0 in | Wt 156.4 lb

## 2017-09-01 DIAGNOSIS — R1110 Vomiting, unspecified: Secondary | ICD-10-CM

## 2017-09-01 DIAGNOSIS — R197 Diarrhea, unspecified: Secondary | ICD-10-CM

## 2017-09-01 DIAGNOSIS — Z8719 Personal history of other diseases of the digestive system: Secondary | ICD-10-CM

## 2017-09-01 DIAGNOSIS — K921 Melena: Secondary | ICD-10-CM

## 2017-09-01 LAB — HEMOCCULT GUIAC POC 1CARD (OFFICE): FECAL OCCULT BLD: NEGATIVE

## 2017-09-01 NOTE — Progress Notes (Signed)
Name: Amalia GreenhouseKimberly Berent   MRN: [id number]    DOB: [birth year]   Date:09/01/2017       Progress Note  Subjective  Chief Complaint  Chief Complaint  Patient presents with  . GI Problem    patient presents with GI upset.  . Nausea  . Vomiting  . Rectal Bleeding    patient stated that it is bright red blood for about 2 weeks ago that she thought was related to her diverticulitis.    HPI  Patient presents concern for GI bleed:  2 Weeks ago she noticed blood in stool for several days - did not seek medical attention at that time, symptoms improved and blood in stools stopped for a few days.  Last night, she developed significant central abdominal pain (x3 hours), had BM with a normal stool, but with surrounding bright red blood.  A little later she developed nausea (still ongoing), vomiting (Multiple episodes over a 30 minute period last night - no blood or coffee ground emesis).  Has had 1 loose stool this morning at 0500 - did not have blood in this BM.  No known hemorrhoids, no anticoagulation therapy, no NSAID use, has glass of wine about 2 times a week, no recent changes in her diet, no prior abdominal surgeries aside from sigmoidoscopy.   At present she denies abdominal pain, but endorses significant nausea.  Pt has history of diverticulitis - most recent episode was 04/29/16, was treated in the ER with Cipro/Flagyl and improved.  Had flexible sigmoidoscopy in 2010, the area of the colon that was examined was WNL, and notation states for patient to return only PRN.  She states this does not feel like her prior diverticulitis symptoms - as this is usually LLQ pain.  Patient Active Problem List   Diagnosis Date Noted  . Diverticulosis 06/18/2015  . History of hysterectomy, supracervical 06/18/2015  . Breast lump in female 06/17/2015  . Chronic constipation 06/17/2015  . Cephalalgia 06/17/2015    Social History   Tobacco Use  . Smoking status: Never Smoker  . Smokeless tobacco: Never  Used  Substance Use Topics  . Alcohol use: Yes    Alcohol/week: 0.0 oz    No current outpatient medications on file.  No Known Allergies  ROS  Constitutional: Negative for fever or weight change.  Respiratory: Negative for cough and shortness of breath.   Cardiovascular: Negative for chest pain or palpitations.  Gastrointestinal: See HPI; no urinary symptoms Skin: Negative for rash.  Neurological: Negative for dizziness, lightheadedness, or headache.  No other specific complaints in a complete review of systems (except as listed in HPI above).  Objective  Vitals:   09/01/17 0912  BP: 118/82  Pulse: 73  Resp: 16  Temp: 97.9 F (36.6 C)  TempSrc: Oral  SpO2: 99%  Weight: 156 lb 6.4 oz (70.9 kg)  Height: 5\' 7"  (1.702 m)   Body mass index is 24.5 kg/m.  Nursing Note and Vital Signs reviewed.  Physical Exam  Constitutional: Patient appears well-developed and well-nourished.  No distress.  HEENT: head atraumatic, normocephalic Cardiovascular: Normal rate, regular rhythm, S1/S2 present.  No murmur or rub heard. No BLE edema. Pulmonary/Chest: Effort normal and breath sounds clear. No respiratory distress or retractions. Abdominal: Soft and mild tenderness to LLQ, bowel sounds present x4 quadrants.  Psychiatric: Patient has a normal mood and affect. behavior is normal. Judgment and thought content normal.  No results found for this or any previous visit (from the past 2160 hour(s)).  Assessment & Plan  1. Blood in stool, frank - POCT Occult Blood Stool - Negative, though question if adequate sample obtained. - Ambulatory referral to Gastroenterology 2. History of diverticulitis - Ambulatory referral to Gastroenterology 3. Vomiting and diarrhea - POCT Occult Blood Stool  - Discussed patient's case with PCP Dr. Carlynn PurlSowles who recommends pt present for emergency care due to ongoing and unclear nature of rectal bleeding. Patient is in agreement and will present [facility] ER  for rectal bleeding. Referral to GI is placed to ensure follow up is established with specialty.

## 2017-09-01 NOTE — Patient Instructions (Signed)
Please go directly to [facility] ER for further evaluation.  

## 2017-09-01 NOTE — Telephone Encounter (Signed)
  Reason for Disposition . [1] Constant abdominal pain AND [2] present > 2 hours  Answer Assessment - Initial Assessment Questions 1. DIARRHEA SEVERITY: "How bad is the diarrhea?" "How many extra stools have you had in the past 24 hours than normal?"    - MILD: Few loose or mushy BMs; increase of 1-3 stools over normal daily number of stools; mild increase in ostomy output.   - MODERATE: Increase of 4-6 stools daily over normal; moderate increase in ostomy output.   - SEVERE (or Worst Possible): Increase of 7 or more stools daily over normal; moderate increase in ostomy output; incontinence.     2 episodes since last night- mild- patient is concerned due to her history of diverticulitis and blood in stools  2. ONSET: "When did the diarrhea begin?"       Last night 3. BM CONSISTENCY: "How loose or watery is the diarrhea?"      watery 4. VOMITING: "Are you also vomiting?" If so, ask: "How many times in the past 24 hours?"      Yes-1 time last night- over a period on 30-45 miuntes- nonstop 5. ABDOMINAL PAIN: "Are you having any abdominal pain?" If yes: "What does it feel like?" (e.g., crampy, dull, intermittent, constant)      No- more nausea now 6. ABDOMINAL PAIN SEVERITY: If present, ask: "How bad is the pain?"  (e.g., Scale 1-10; mild, moderate, or severe)    - MILD (1-3): doesn't interfere with normal activities, abdomen soft and not tender to touch     - MODERATE (4-7): interferes with normal activities or awakens from sleep, tender to touch     - SEVERE (8-10): excruciating pain, doubled over, unable to do any normal activities       1-3- last night patient attempted to go to ED- but left due to wait time 7. ORAL INTAKE: If vomiting, "Have you been able to drink liquids?" "How much fluids have you had in the past 24 hours?"     Patient has had ginger tea this morning 8. HYDRATION: "Any signs of dehydration?" (e.g., dry mouth [not just dry lips], too weak to stand, dizziness, new weight  loss) "When did you last urinate?"     Patient is feeling weak 9. EXPOSURE: "Have you traveled to a foreign country recently?" "Have you been exposed to anyone with diarrhea?" "Could you have eaten any food that was spoiled?"     no 10. OTHER SYMPTOMS: "Do you have any other symptoms?" (e.g., fever, blood in stool)       Blood in stool at the beginning 11. PREGNANCY: "Is there any chance you are pregnant?" "When was your last menstrual period?"       N/a- hysterectomy  Protocols used: DIARRHEA-A-AH

## 2017-09-15 ENCOUNTER — Encounter: Payer: Self-pay | Admitting: Gastroenterology

## 2017-09-15 ENCOUNTER — Other Ambulatory Visit
Admission: RE | Admit: 2017-09-15 | Discharge: 2017-09-15 | Disposition: A | Discharge status: Home / Self Care | Payer: 59 | Source: Ambulatory Visit | Attending: Gastroenterology | Admitting: Gastroenterology

## 2017-09-15 ENCOUNTER — Ambulatory Visit: Payer: 59 | Admitting: Gastroenterology

## 2017-09-15 VITALS — BP 129/89 | HR 85 | Temp 98.0°F | Ht 67.0 in | Wt 157.4 lb

## 2017-09-15 DIAGNOSIS — K625 Hemorrhage of anus and rectum: Secondary | ICD-10-CM

## 2017-09-15 DIAGNOSIS — R1013 Epigastric pain: Secondary | ICD-10-CM | POA: Diagnosis not present

## 2017-09-15 DIAGNOSIS — K5792 Diverticulitis of intestine, part unspecified, without perforation or abscess without bleeding: Secondary | ICD-10-CM | POA: Diagnosis not present

## 2017-09-15 LAB — CBC WITH DIFFERENTIAL/PLATELET
BASOS ABS: 0 10*3/uL (ref 0–0.1)
BASOS PCT: 1 %
Eosinophils Absolute: 0.3 10*3/uL (ref 0–0.7)
Eosinophils Relative: 6 %
HCT: 37.9 % (ref 35.0–47.0)
HEMOGLOBIN: 12.7 g/dL (ref 12.0–16.0)
LYMPHS PCT: 34 %
Lymphs Abs: 1.9 10*3/uL (ref 1.0–3.6)
MCH: 29.9 pg (ref 26.0–34.0)
MCHC: 33.6 g/dL (ref 32.0–36.0)
MCV: 89.2 fL (ref 80.0–100.0)
Monocytes Absolute: 0.4 10*3/uL (ref 0.2–0.9)
Monocytes Relative: 8 %
NEUTROS ABS: 2.9 10*3/uL (ref 1.4–6.5)
NEUTROS PCT: 51 %
Platelets: 225 10*3/uL (ref 150–440)
RBC: 4.24 MIL/uL (ref 3.80–5.20)
RDW: 12.4 % (ref 11.5–14.5)
WBC: 5.6 10*3/uL (ref 3.6–11.0)

## 2017-09-15 LAB — COMPREHENSIVE METABOLIC PANEL
ALBUMIN: 4 g/dL (ref 3.5–5.0)
ALT: 16 U/L (ref 14–54)
AST: 17 U/L (ref 15–41)
Alkaline Phosphatase: 74 U/L (ref 38–126)
Anion gap: 6 (ref 5–15)
BILIRUBIN TOTAL: 0.4 mg/dL (ref 0.3–1.2)
BUN: 13 mg/dL (ref 6–20)
CHLORIDE: 107 mmol/L (ref 101–111)
CO2: 24 mmol/L (ref 22–32)
CREATININE: 0.97 mg/dL (ref 0.44–1.00)
Calcium: 8.7 mg/dL — ABNORMAL LOW (ref 8.9–10.3)
GFR calc Af Amer: >60 mL/min (ref >60)
GLUCOSE: 105 mg/dL — AB (ref 65–99)
Potassium: 3.4 mmol/L — ABNORMAL LOW (ref 3.5–5.1)
Sodium: 137 mmol/L (ref 135–145)
TOTAL PROTEIN: 7 g/dL (ref 6.5–8.1)

## 2017-09-15 NOTE — Addendum Note (Signed)
Addended by: Ardyth ManARTER, Kadee Philyaw Z on: 09/15/2017 10:58 AM   Modules accepted: Orders, SmartSet

## 2017-09-15 NOTE — Addendum Note (Signed)
Addended by: Ardyth ManARTER, Raeleigh Guinn Z on: 09/15/2017 10:23 AM   Modules accepted: Orders

## 2017-09-15 NOTE — Progress Notes (Signed)
Wyline MoodKiran Ellora Varnum MD, MRCP(U.K) [address]  [address]  [place], [place] [address]  Main: [phone]  Fax: [phone]   Gastroenterology Consultation  Referring Provider:     Doren CustardBoyce, Emily E, FNP Primary Care Physician:  Alba CorySowles, Krichna, MD Primary Gastroenterologist:  Dr. Wyline MoodKiran Sabree Nuon  Reason for Consultation:     Rectal bleeding         HPI:   Donna Wyatt is a 46 y.o. y/o female referred for consultation & management  by Dr. Carlynn PurlSowles, Danna HeftyKrichna, MD.    She was recently referred to me to be evaluated for blood in the stool and recent episode of diverticulitis.  She was seen in the ER on 04/29/2016 when she presented with lower left quadrant abdominal discomfort with blood streaked stools.  She had a day and 3 episodes of blood in the stool over 3 months.  She has never had a colonoscopy.  She has had prior episodes of diverticulitis.  No abdominal imaging was obtained at the time of the ER visit she was treated empirically with antibiotics and discharged.  I do note a prior episode of diverticulitis in November 2014 when I see a CT scan of the abdomen and pelvis which demonstrates mild proximal sigmoid diverticulitis.  She was seen by physician on 09/01/2017 and complained of blood in the stool on multiple occasions.  I do not see a recent CBC.   Rectal bleeding :  Onset and where was blood seen  :1 year - panicked - was seen at urgent care- was told she had diverticulitis - took antibiotics and resolved. 2-3 weeks back saw Dr Carlynn PurlSowles for similar rectal bleeding but had no abdominal pain . Recurred last week .  Frequency of bowel movements :Prior to starting metamucil had a bowel movement every few days and was like pebbles, after starting metamucil has been every day and softer.  Change in shape of stool:no  Pain associated with bowel movements:no  Blood thinner usage:no  NSAID's: no  Prior colonoscopy :no  Family history of colon cancer or polyps:no  Weight loss:no   Presently  she says that she has some nausea, some lower abdominal discomfort.   Past Medical History:  Diagnosis Date  . Chronic constipation   . Diverticulitis   . History of uterine fibroid     Past Surgical History:  Procedure Laterality Date  . ABDOMINAL HYSTERECTOMY      Prior to Admission medications   Medication Sig Start Date End Date Taking? Authorizing Provider  psyllium (METAMUCIL) 58.6 % packet Take 1 packet by mouth daily.   Yes [provider]    Family History  Problem Relation Age of Onset  . Diabetes Mother   . Hyperlipidemia Father   . Hypertension Father   . Glaucoma Father   . Hyperlipidemia Sister   . Hypertension Sister   . Asthma Sister   . Fibroids Sister      Social History   Tobacco Use  . Smoking status: Never Smoker  . Smokeless tobacco: Never Used  Substance Use Topics  . Alcohol use: Yes    Alcohol/week: 0.0 oz  . Drug use: No    Allergies as of 09/15/2017  . (No Known Allergies)    Review of Systems:    All systems reviewed and negative except where noted in HPI.   Physical Exam:  BP 129/89 (BP Location: Left Arm, Patient Position: Sitting, Cuff Size: Normal)   Pulse 85   Temp 98 F (36.7 C) (  Oral)   Ht 5\' 7"  (1.702 m)   Wt 157 lb 6.4 oz (71.4 kg)   BMI 24.65 kg/m  No LMP recorded. Patient has had a hysterectomy. Psych:  Alert and cooperative. Normal mood and affect. General:   Alert,  Well-developed, well-nourished, pleasant and cooperative in NAD Head:  Normocephalic and atraumatic. Eyes:  Sclera clear, no icterus.   Conjunctiva pink. Ears:  Normal auditory acuity. Nose:  No deformity, discharge, or lesions. Mouth:  No deformity or lesions,oropharynx pink & moist. Neck:  Supple; no masses or thyromegaly. Lungs:  Respirations even and unlabored.  Clear throughout to auscultation.   No wheezes, crackles, or rhonchi. No acute distress. Heart:  Regular rate and rhythm; no murmurs, clicks, rubs, or gallops. Abdomen:   Normal bowel sounds.  No bruits.  Soft, non-tender and non-distended without masses, hepatosplenomegaly or hernias noted.  No guarding or rebound tenderness.    Neurologic:  Alert and oriented x3;  grossly normal neurologically. Skin:  Intact without significant lesions or rashes. No jaundice. Lymph Nodes:  No significant cervical adenopathy. Psych:  Alert and cooperative. Normal mood and affect.  Imaging Studies: No results found.  Assessment and Plan:   Donna Wyatt is a 46 y.o. y/o female has been referred for rectal bleeding and prior history of diverticulitis. She has never had a colonoscopy .  She does have some mild lower abdominal discomfort. I will rule out acute diverticulitis.   Plan   1. CBC,CMP 2. Diagnostic colonoscopy ASAP if CT scan below is negative 3.  CT scan of the abdomen and pelvis with contrast to rule out diverticulitis 4.  I did briefly speak to her about surgical options as she has had multiple episodes of diverticulitis in the sigmoid area and she is kind of exhausted with these episodes.  We will talk further at her next visit  I have discussed alternative options, risks & benefits,  which include, but are not limited to, bleeding, infection, perforation,respiratory complication & drug reaction.  The patient agrees with this plan & written consent will be obtained.    Follow up in 6 weeks   Dr Wyline MoodKiran Ethleen Lormand MD,MRCP(U.K)

## 2017-09-22 ENCOUNTER — Telehealth: Payer: Self-pay

## 2017-09-22 NOTE — Telephone Encounter (Signed)
Pt has been informed labs are normal.  Thanks Tyresha Fede 

## 2017-09-22 NOTE — Telephone Encounter (Signed)
-----   Message from Wyline MoodKiran Anna, MD sent at 09/21/2017  5:06 PM EST ----- Normal labs

## 2017-09-22 NOTE — Telephone Encounter (Signed)
LVM for pt to contact office for results. Thanks Nicolet Griffy 

## 2017-09-22 NOTE — Telephone Encounter (Signed)
-----   Message from Kiran Anna, MD sent at 09/21/2017  5:06 PM EST ----- Normal labs 

## 2017-09-27 ENCOUNTER — Ambulatory Visit
Admission: RE | Admit: 2017-09-27 | Discharge: 2017-09-27 | Disposition: A | Discharge status: Home / Self Care | Payer: 59 | Source: Ambulatory Visit | Attending: Gastroenterology | Admitting: Gastroenterology

## 2017-09-27 DIAGNOSIS — K5730 Diverticulosis of large intestine without perforation or abscess without bleeding: Secondary | ICD-10-CM | POA: Diagnosis not present

## 2017-09-27 DIAGNOSIS — R1013 Epigastric pain: Secondary | ICD-10-CM | POA: Diagnosis not present

## 2017-09-27 MED ORDER — IOPAMIDOL (ISOVUE-300) INJECTION 61%
100.0000 mL | Freq: Once | INTRAVENOUS | Status: AC | PRN
  Administered 2017-09-27: 100 mL via INTRAVENOUS

## 2017-09-29 ENCOUNTER — Telehealth: Payer: Self-pay

## 2017-09-29 NOTE — Telephone Encounter (Signed)
Advised patient of results per Dr. Tobi BastosAnna.    - No acute abnormality - can proceed with colonoscopy asap   Patient scheduled for colonoscopy 10/05/17.

## 2017-09-30 ENCOUNTER — Encounter: Payer: Self-pay | Admitting: *Deleted

## 2017-10-04 ENCOUNTER — Telehealth: Payer: Self-pay | Admitting: Gastroenterology

## 2017-10-04 NOTE — Telephone Encounter (Signed)
Patient called today to cancel her colonoscopy scheduled for 10-05-17 with Dr Tobi BastosAnna.Please call patient to reschedule.

## 2017-10-04 NOTE — Telephone Encounter (Signed)
Patients colonoscopy has been canceled for tomorrow because work.  She will call back to schedule when she looks at her work schedule.  Thanks [employer]

## 2017-10-05 ENCOUNTER — Ambulatory Visit: Admission: RE | Admit: 2017-10-05 | Payer: 59 | Source: Ambulatory Visit | Admitting: Gastroenterology

## 2017-10-05 ENCOUNTER — Encounter: Admission: RE | Payer: Self-pay | Source: Ambulatory Visit

## 2017-10-05 SURGERY — COLONOSCOPY WITH PROPOFOL
Anesthesia: General

## 2018-02-22 ENCOUNTER — Encounter: Payer: 59 | Admitting: Nurse Practitioner

## 2018-03-01 ENCOUNTER — Encounter: Payer: Self-pay | Admitting: Nurse Practitioner

## 2018-03-01 ENCOUNTER — Ambulatory Visit (INDEPENDENT_AMBULATORY_CARE_PROVIDER_SITE_OTHER): Payer: 59 | Admitting: Nurse Practitioner

## 2018-03-01 VITALS — BP 120/80 | HR 85 | Temp 98.4°F | Resp 16 | Ht 66.0 in | Wt 161.2 lb

## 2018-03-01 DIAGNOSIS — K5792 Diverticulitis of intestine, part unspecified, without perforation or abscess without bleeding: Secondary | ICD-10-CM | POA: Diagnosis not present

## 2018-03-01 DIAGNOSIS — H6121 Impacted cerumen, right ear: Secondary | ICD-10-CM

## 2018-03-01 DIAGNOSIS — Z1212 Encounter for screening for malignant neoplasm of rectum: Secondary | ICD-10-CM

## 2018-03-01 DIAGNOSIS — Z01419 Encounter for gynecological examination (general) (routine) without abnormal findings: Secondary | ICD-10-CM | POA: Diagnosis not present

## 2018-03-01 DIAGNOSIS — Z1211 Encounter for screening for malignant neoplasm of colon: Secondary | ICD-10-CM

## 2018-03-01 NOTE — Patient Instructions (Signed)
Please do call to schedule your mammogram; the number to schedule one at either [facility] or [facility] is [phone]   

## 2018-03-01 NOTE — Progress Notes (Addendum)
Name: Donna Wyatt   MRN: [id number]    DOB: 1971-04-05   Date:03/01/2018       Progress Note  Subjective  Chief Complaint  Chief Complaint  Patient presents with  . Annual Exam    HPI  Patient presents for annual CPE.  Diet: eats 3 meals a day; doesn't eat as much fiber as she should but takes medicine. Drink 32 ounces of water aday mostly with occasional soda. Weekly about 6 servings of veggies, and 6 of fruits. Mostly cooks at home Exercise: lots of walking in the hospital and occasionally walks the dog ( few times a week.)   USPSTF grade A and B recommendations  Depression:  Depression screen [facility] 2/9 03/01/2018 09/01/2017 07/28/2016 06/18/2015  Decreased Interest 0 0 0 0  Down, Depressed, Hopeless 0 0 0 0  PHQ - 2 Score 0 0 0 0   Hypertension: BP Readings from Last 3 Encounters:  03/01/18 120/80  09/15/17 129/89  09/01/17 118/82   Obesity: Wt Readings from Last 3 Encounters:  03/01/18 161 lb 3.2 oz (73.1 kg)  09/15/17 157 lb 6.4 oz (71.4 kg)  09/01/17 156 lb 6.4 oz (70.9 kg)   BMI Readings from Last 3 Encounters:  03/01/18 26.02 kg/m  09/15/17 24.65 kg/m  09/01/17 24.50 kg/m    Alcohol: 2-3 times a week 1 glass of wine Tobacco use: no HIV, hep B, hep C: declines  STD testing and prevention (chl/gon/syphilis): declines Intimate partner violence: denies  Sexual History/Pain during Intercourse: denies  Menstrual History/LMP/Abnormal Bleeding: hysterectomy- no bleeding  Advanced Care Planning: A voluntary discussion about advance care planning including the explanation and discussion of advance directives.  Discussed health care proxy and Living will, and the patient was able to identify a health care proxy as (husband) Vassie Kugel [phone].  Patient does not have a living will at present time. If patient does have living will, I have requested they bring this to the clinic to be scanned in to their chart.  Breast cancer:  [facility] Mammogram  Date Value Ref  Range Status  08/17/2016 0-4 Bi-Rad 0-4 Bi-Rad, Self Reported Normal Final    Comment:    negative    Cervical cancer screening: hysterectomy   Patient ate olive garden- chicken parm Lipids:  No results found for: CHOL No results found for: HDL No results found for: LDLCALC No results found for: TRIG No results found for: CHOLHDL No results found for: LDLDIRECT  Glucose:  Glucose  Date Value Ref Range Status  08/26/2013 96 65 - 99 mg/dL Final   Glucose, Bld  Date Value Ref Range Status  09/15/2017 105 (H) 65 - 99 mg/dL Final  86/57/8469 94 65 - 99 mg/dL Final    Skin cancer: denies personal or family history. Doesn't typically use sunscreen but is not in the sun a lot.  Colorectal cancer: denies any family or personal history, has hx diverticulosis 47 years old.  Patient Active Problem List   Diagnosis Date Noted  . Diverticulosis 06/18/2015  . History of hysterectomy, supracervical 06/18/2015  . Breast lump in female 06/17/2015  . Chronic constipation 06/17/2015  . Cephalalgia 06/17/2015    Past Surgical History:  Procedure Laterality Date  . ABDOMINAL HYSTERECTOMY      Family History  Problem Relation Age of Onset  . Diabetes Mother   . Hyperlipidemia Father   . Hypertension Father   . Glaucoma Father   . Hyperlipidemia Sister   . Hypertension Sister   . Asthma  Sister   . Fibroids Sister     Social History   Socioeconomic History  . Marital status: Married    Spouse name: Greig Castilla  . Number of children: 2  . Years of education: Not on file  . Highest education level: Associate degree: academic program  Occupational History  . Not on file  Social Needs  . Financial resource strain: Not hard at all  . Food insecurity:    Worry: Never true    Inability: Never true  . Transportation needs:    Medical: No    Non-medical: No  Tobacco Use  . Smoking status: Never Smoker  . Smokeless tobacco: Never Used  Substance and Sexual Activity  . Alcohol  use: Yes    Alcohol/week: 0.0 oz  . Drug use: No  . Sexual activity: Yes    Birth control/protection: None  Lifestyle  . Physical activity:    Days per week: 2 days    Minutes per session: 50 min  . Stress: Not at all  Relationships  . Social connections:    Talks on phone: More than three times a week    Gets together: More than three times a week    Attends religious service: More than 4 times per year    Active member of club or organization: Yes    Attends meetings of clubs or organizations: More than 4 times per year    Relationship status: Married  . Intimate partner violence:    Fear of current or ex partner: No    Emotionally abused: No    Physically abused: No    Forced sexual activity: No  Other Topics Concern  . Not on file  Social History Narrative  . Not on file     Current Outpatient Medications:  .  psyllium (METAMUCIL) 58.6 % packet, Take 1 packet by mouth daily., Disp: , Rfl:   No Known Allergies   ROS  Constitutional: Negative for fever or weight change.  Respiratory: Negative for cough and shortness of breath.   Cardiovascular: Negative for chest pain or palpitations.  Gastrointestinal: Negative for abdominal pain, no bowel changes.  Musculoskeletal: Negative for gait problem or joint swelling.  Skin: Negative for rash.  Neurological: Negative for dizziness or headache.  No other specific complaints in a complete review of systems (except as listed in HPI above).   Objective  Vitals:   03/01/18 1546  BP: 120/80  Pulse: 85  Resp: 16  Temp: 98.4 F (36.9 C)  TempSrc: Oral  SpO2: 98%  Weight: 161 lb 3.2 oz (73.1 kg)  Height: 5\' 6"  (1.676 m)    Body mass index is 26.02 kg/m.  Physical Exam Constitutional: Patient appears well-developed and well-nourished. No distress.  HENT: Head: Normocephalic and atraumatic. Ears: B TMs ok, no erythema or effusion- right ear impacted irrigated and cleared; Nose: Nose normal. Mouth/Throat: Oropharynx  is clear and moist. No oropharyngeal exudate.  Eyes: Conjunctivae and EOM are normal. Pupils are equal, round, and reactive to light. No scleral icterus.  Neck: Normal range of motion. Neck supple. No JVD present.   Cardiovascular: Normal rate, regular rhythm and normal heart sounds.  No murmur heard. No BLE edema. Pulmonary/Chest: Effort normal and breath sounds normal. No respiratory distress. Abdominal: Soft. Bowel sounds are normal, no distension. There is no tenderness. no masses Breast: no lumps or masses, no nipple discharge or rashes FEMALE GENITALIA: deferred  Musculoskeletal: Normal range of motion, no joint effusions. No gross deformities Neurological: he  is alert and oriented to person, place, and time. No cranial nerve deficit. Coordination, balance, strength, speech and gait are normal.  Skin: Skin is warm and dry. No rash noted. No erythema.  Psychiatric: Patient has a normal mood and affect. behavior is normal. Judgment and thought content normal.   No results found for this or any previous visit (from the past 2160 hour(s)).  PHQ2/9: [facility] 03/01/2018 09/01/2017 07/28/2016 06/18/2015  Decreased Interest 0 0 0 0  Down, Depressed, Hopeless 0 0 0 0  PHQ - 2 Score 0 0 0 0     Fall Risk: Fall Risk  03/01/2018 09/01/2017 07/28/2016 06/18/2015  Falls in the past year? No No No No     Functional Status Survey: Is the patient deaf or have difficulty hearing?: No Does the patient have difficulty seeing, even when wearing glasses/contacts?: No Does the patient have difficulty concentrating, remembering, or making decisions?: No Does the patient have difficulty walking or climbing stairs?: No Does the patient have difficulty dressing or bathing?: No Does the patient have difficulty doing errands alone such as visiting a doctor's office or shopping?: No   Assessment & Plan  1. Well woman exam - discussed diet and exercise - CBC - COMPLETE METABOLIC PANEL  WITH GFR - Lipid Profile (patient did have pasta and parmesan chicken 4 hours ago) - HgB A1c -   -USPSTF grade A and B recommendations reviewed with patient; age-appropriate recommendations, preventive care, screening tests, etc discussed and encouraged; healthy living encouraged; see AVS for patient education given to patient -Discussed importance of 150 minutes of physical activity weekly, eat two servings of fish weekly, eat one serving of tree nuts ( cashews, pistachios, pecans, almonds.Marland Kitchen.) every other day, eat 6 servings of fruit/vegetables daily and drink plenty of water and avoid sweet beverages.  . Follow up and care instructions discussed and provided in AVS. -Reviewed Health Maintenance: mammogram discussed   -------------------------------------- I have reviewed this encounter including the documentation in this note and/or discussed this patient with the provider, Sharyon CableElizabeth Beronica Lansdale DNP AGNP-C. I am certifying that I agree with the content of this note as supervising physician. Baruch GoutyMelinda Lada, MD [facility] 03/15/2018, 5:48 PM

## 2018-03-02 LAB — LIPID PANEL
Cholesterol: 206 mg/dL — ABNORMAL HIGH (ref <200)
HDL: 59 mg/dL (ref >50)
LDL Cholesterol (Calc): 119 mg/dL (calc) — ABNORMAL HIGH
Non-HDL Cholesterol (Calc): 147 mg/dL (calc) — ABNORMAL HIGH (ref <130)
Total CHOL/HDL Ratio: 3.5 (calc) (ref <5.0)
Triglycerides: 165 mg/dL — ABNORMAL HIGH (ref <150)

## 2018-03-02 LAB — COMPLETE METABOLIC PANEL WITH GFR
AG RATIO: 1.7 (calc) (ref 1.0–2.5)
ALT: 13 U/L (ref 6–29)
AST: 14 U/L (ref 10–35)
Albumin: 4.3 g/dL (ref 3.6–5.1)
Alkaline phosphatase (APISO): 86 U/L (ref 33–115)
BILIRUBIN TOTAL: 0.4 mg/dL (ref 0.2–1.2)
BUN: 11 mg/dL (ref 7–25)
CHLORIDE: 104 mmol/L (ref 98–110)
CO2: 25 mmol/L (ref 20–32)
Calcium: 9.3 mg/dL (ref 8.6–10.2)
Creat: 0.69 mg/dL (ref 0.50–1.10)
GFR, EST AFRICAN AMERICAN: 121 mL/min/{1.73_m2} (ref >=60)
GFR, Est Non African American: 104 mL/min/{1.73_m2} (ref >=60)
GLUCOSE: 88 mg/dL (ref 65–139)
Globulin: 2.6 g/dL (calc) (ref 1.9–3.7)
POTASSIUM: 3.9 mmol/L (ref 3.5–5.3)
Sodium: 137 mmol/L (ref 135–146)
TOTAL PROTEIN: 6.9 g/dL (ref 6.1–8.1)

## 2018-03-02 LAB — HEMOGLOBIN A1C
Hgb A1c MFr Bld: 4.6 % of total Hgb (ref <5.7)
Mean Plasma Glucose: 85 (calc)
eAG (mmol/L): 4.7 (calc)

## 2018-03-02 LAB — CBC
HCT: 40.8 % (ref 35.0–45.0)
HEMOGLOBIN: 13.8 g/dL (ref 11.7–15.5)
MCH: 29.2 pg (ref 27.0–33.0)
MCHC: 33.8 g/dL (ref 32.0–36.0)
MCV: 86.4 fL (ref 80.0–100.0)
MPV: 11 fL (ref 7.5–12.5)
PLATELETS: 233 10*3/uL (ref 140–400)
RBC: 4.72 10*6/uL (ref 3.80–5.10)
RDW: 11.8 % (ref 11.0–15.0)
WBC: 7.4 10*3/uL (ref 3.8–10.8)

## 2018-03-04 ENCOUNTER — Encounter: Payer: Self-pay | Admitting: Gastroenterology

## 2018-06-24 ENCOUNTER — Other Ambulatory Visit: Payer: Self-pay | Admitting: Family Medicine

## 2018-06-24 DIAGNOSIS — Z1231 Encounter for screening mammogram for malignant neoplasm of breast: Secondary | ICD-10-CM

## 2018-07-11 ENCOUNTER — Ambulatory Visit
Admission: RE | Admit: 2018-07-11 | Discharge: 2018-07-11 | Disposition: A | Discharge status: Home / Self Care | Payer: 59 | Source: Ambulatory Visit | Attending: Family Medicine | Admitting: Family Medicine

## 2018-07-11 DIAGNOSIS — Z1231 Encounter for screening mammogram for malignant neoplasm of breast: Secondary | ICD-10-CM | POA: Diagnosis not present

## 2018-10-30 DIAGNOSIS — Z5181 Encounter for therapeutic drug level monitoring: Secondary | ICD-10-CM | POA: Diagnosis not present

## 2018-10-30 DIAGNOSIS — R0789 Other chest pain: Secondary | ICD-10-CM | POA: Diagnosis not present

## 2018-10-30 DIAGNOSIS — R9431 Abnormal electrocardiogram [ECG] [EKG]: Secondary | ICD-10-CM | POA: Diagnosis not present

## 2018-10-30 DIAGNOSIS — R071 Chest pain on breathing: Secondary | ICD-10-CM | POA: Diagnosis not present

## 2018-10-30 DIAGNOSIS — R079 Chest pain, unspecified: Secondary | ICD-10-CM | POA: Diagnosis not present

## 2018-10-30 DIAGNOSIS — I517 Cardiomegaly: Secondary | ICD-10-CM | POA: Diagnosis not present

## 2018-10-30 DIAGNOSIS — Z79899 Other long term (current) drug therapy: Secondary | ICD-10-CM | POA: Diagnosis not present

## 2018-11-30 ENCOUNTER — Other Ambulatory Visit (HOSPITAL_COMMUNITY)
Admission: RE | Admit: 2018-11-30 | Discharge: 2018-11-30 | Disposition: A | Discharge status: Home / Self Care | Payer: 59 | Source: Ambulatory Visit | Attending: Family Medicine | Admitting: Family Medicine

## 2018-11-30 ENCOUNTER — Encounter: Payer: Self-pay | Admitting: Family Medicine

## 2018-11-30 ENCOUNTER — Ambulatory Visit (INDEPENDENT_AMBULATORY_CARE_PROVIDER_SITE_OTHER): Payer: 59 | Admitting: Family Medicine

## 2018-11-30 VITALS — BP 128/84 | HR 88 | Temp 97.8°F | Resp 16 | Ht 66.0 in | Wt 159.0 lb

## 2018-11-30 DIAGNOSIS — Z1212 Encounter for screening for malignant neoplasm of rectum: Secondary | ICD-10-CM | POA: Diagnosis not present

## 2018-11-30 DIAGNOSIS — Z01419 Encounter for gynecological examination (general) (routine) without abnormal findings: Secondary | ICD-10-CM

## 2018-11-30 DIAGNOSIS — Z1159 Encounter for screening for other viral diseases: Secondary | ICD-10-CM | POA: Diagnosis not present

## 2018-11-30 DIAGNOSIS — Z124 Encounter for screening for malignant neoplasm of cervix: Secondary | ICD-10-CM | POA: Diagnosis not present

## 2018-11-30 DIAGNOSIS — Z1211 Encounter for screening for malignant neoplasm of colon: Secondary | ICD-10-CM | POA: Diagnosis not present

## 2018-11-30 NOTE — Patient Instructions (Signed)
Preventive Care 40-64 Years, Female Preventive care refers to lifestyle choices and visits with your health care provider that can promote health and wellness. What does preventive care include?   A yearly physical exam. This is also called an annual well check.  Dental exams once or twice a year.  Routine eye exams. Ask your health care provider how often you should have your eyes checked.  Personal lifestyle choices, including: ? Daily care of your teeth and gums. ? Regular physical activity. ? Eating a healthy diet. ? Avoiding tobacco and drug use. ? Limiting alcohol use. ? Practicing safe sex. ? Taking low-dose aspirin daily starting at age 50. ? Taking vitamin and mineral supplements as recommended by your health care provider. What happens during an annual well check? The services and screenings done by your health care provider during your annual well check will depend on your age, overall health, lifestyle risk factors, and family history of disease. Counseling Your health care provider may ask you questions about your:  Alcohol use.  Tobacco use.  Drug use.  Emotional well-being.  Home and relationship well-being.  Sexual activity.  Eating habits.  Work and work environment.  Method of birth control.  Menstrual cycle.  Pregnancy history. Screening You may have the following tests or measurements:  Height, weight, and BMI.  Blood pressure.  Lipid and cholesterol levels. These may be checked every 5 years, or more frequently if you are over 50 years old.  Skin check.  Lung cancer screening. You may have this screening every year starting at age 55 if you have a 30-pack-year history of smoking and currently smoke or have quit within the past 15 years.  Colorectal cancer screening. All adults should have this screening starting at age 50 and continuing until age 75. Your health care provider may recommend screening at age 45. You will have tests every  1-10 years, depending on your results and the type of screening test. People at increased risk should start screening at an earlier age. Screening tests may include: ? Guaiac-based fecal occult blood testing. ? Fecal immunochemical test (FIT). ? Stool DNA test. ? Virtual colonoscopy. ? Sigmoidoscopy. During this test, a flexible tube with a tiny camera (sigmoidoscope) is used to examine your rectum and lower colon. The sigmoidoscope is inserted through your anus into your rectum and lower colon. ? Colonoscopy. During this test, a long, thin, flexible tube with a tiny camera (colonoscope) is used to examine your entire colon and rectum.  Hepatitis C blood test.  Hepatitis B blood test.  Sexually transmitted disease (STD) testing.  Diabetes screening. This is done by checking your blood sugar (glucose) after you have not eaten for a while (fasting). You may have this done every 1-3 years.  Mammogram. This may be done every 1-2 years. Talk to your health care provider about when you should start having regular mammograms. This may depend on whether you have a family history of breast cancer.  BRCA-related cancer screening. This may be done if you have a family history of breast, ovarian, tubal, or peritoneal cancers.  Pelvic exam and Pap test. This may be done every 3 years starting at age 21. Starting at age 30, this may be done every 5 years if you have a Pap test in combination with an HPV test.  Bone density scan. This is done to screen for osteoporosis. You may have this scan if you are at high risk for osteoporosis. Discuss your test results, treatment options,   and if necessary, the need for more tests with your health care provider. Vaccines Your health care provider may recommend certain vaccines, such as:  Influenza vaccine. This is recommended every year.  Tetanus, diphtheria, and acellular pertussis (Tdap, Td) vaccine. You may need a Td booster every 10 years.  Varicella  vaccine. You may need this if you have not been vaccinated.  Zoster vaccine. You may need this after age 38.  Measles, mumps, and rubella (MMR) vaccine. You may need at least one dose of MMR if you were born in 1957 or later. You may also need a second dose.  Pneumococcal 13-valent conjugate (PCV13) vaccine. You may need this if you have certain conditions and were not previously vaccinated.  Pneumococcal polysaccharide (PPSV23) vaccine. You may need one or two doses if you smoke cigarettes or if you have certain conditions.  Meningococcal vaccine. You may need this if you have certain conditions.  Hepatitis A vaccine. You may need this if you have certain conditions or if you travel or work in places where you may be exposed to hepatitis A.  Hepatitis B vaccine. You may need this if you have certain conditions or if you travel or work in places where you may be exposed to hepatitis B.  Haemophilus influenzae type b (Hib) vaccine. You may need this if you have certain conditions. Talk to your health care provider about which screenings and vaccines you need and how often you need them. This information is not intended to replace advice given to you by your health care provider. Make sure you discuss any questions you have with your health care provider. Document Released: 10/11/2015 Document Revised: 11/04/2017 Document Reviewed: 07/16/2015 Elsevier Interactive Patient Education  2019 [employer].

## 2018-11-30 NOTE — Progress Notes (Signed)
Name: Donna Wyatt   MRN: [id number]    DOB: April 25, 1971   Date:11/30/2018       Progress Note  Subjective  Chief Complaint  Chief Complaint  Patient presents with  . Insomnia    Sleeping on over 4 to 5 nightly then she will be unable to go back to sleep. Cutting back on sugars, melatonin-gives her nightmares. By 7 p.m. she is extremely tired    HPI   Patient presents for annual CPE   Diet:  eating lean meat and vegetables, avoids processed food   Exercise: 3 times a week for 30 minutes   USPSTF grade A and B recommendations    Office Visit from 11/30/2018 in [facility]  AUDIT-C Score  2     Depression:  Depression screen [facility] 2/9 11/30/2018 03/01/2018 09/01/2017 07/28/2016 06/18/2015  Decreased Interest 0 0 0 0 0  Down, Depressed, Hopeless 0 0 0 0 0  PHQ - 2 Score 0 0 0 0 0  Altered sleeping 3 - - - -  Tired, decreased energy 1 - - - -  Change in appetite 0 - - - -  Feeling bad or failure about yourself  0 - - - -  Trouble concentrating 0 - - - -  Moving slowly or fidgety/restless 0 - - - -  Suicidal thoughts 0 - - - -  PHQ-9 Score 4 - - - -  Difficult doing work/chores Somewhat difficult - - - -   Hypertension: BP Readings from Last 3 Encounters:  11/30/18 128/84  03/01/18 120/80  09/15/17 129/89   Obesity: Wt Readings from Last 3 Encounters:  11/30/18 159 lb (72.1 kg)  03/01/18 161 lb 3.2 oz (73.1 kg)  09/15/17 157 lb 6.4 oz (71.4 kg)   BMI Readings from Last 3 Encounters:  11/30/18 25.66 kg/m  03/01/18 26.02 kg/m  09/15/17 24.65 kg/m    Hep C Screening:  STD testing and prevention (HIV/chl/gon/syphilis): N/A Intimate partner violence : negative screen  Sexual History/Pain during Intercourse: no pain, normal libido  Menstrual History/LMP/Abnormal Bleeding: s/p hysterectomy supra-cervical  Incontinence Symptoms: none   Advanced Care Planning: A voluntary discussion about advance care planning including the explanation and discussion  of advance directives.  Discussed health care proxy and Living will, and the patient was able to identify a health care proxy as husband .  Patient does not have a living will at present time.   Breast cancer:  HM Mammogram  Date Value Ref Range Status  08/17/2016 0-4 Bi-Rad 0-4 Bi-Rad, Self Reported Normal Final    Comment:    negative    BRCA gene screening: N/A Cervical cancer screening: today   Osteoporosis prevention: discussed increase in physical activity and high calcium diet   Lipids:  Lab Results  Component Value Date   CHOL 206 (H) 03/01/2018   Lab Results  Component Value Date   HDL 59 03/01/2018   Lab Results  Component Value Date   LDLCALC 119 (H) 03/01/2018   Lab Results  Component Value Date   TRIG 165 (H) 03/01/2018   Lab Results  Component Value Date   CHOLHDL 3.5 03/01/2018   No results found for: LDLDIRECT  Glucose:  Glucose  Date Value Ref Range Status  08/26/2013 96 65 - 99 mg/dL Final   Glucose, Bld  Date Value Ref Range Status  03/01/2018 88 65 - 139 mg/dL Final    Comment:    .  Non-fasting reference interval .   09/15/2017 105 (H) 65 - 99 mg/dL Final  04/29/2016 94 65 - 99 mg/dL Final    Skin cancer: discussed atypical lesions  Colorectal cancer: we will refer her for colonoscopy   Lung cancer:  Low Dose CT Chest recommended if Age 6-80 years, 30 pack-year currently smoking OR have quit w/in 15years. Patient does not qualify.   ECG 12/2009   Patient Active Problem List   Diagnosis Date Noted  . Diverticulosis 06/18/2015  . History of hysterectomy, supracervical 06/18/2015  . Breast lump in female 06/17/2015  . Chronic constipation 06/17/2015  . Cephalalgia 06/17/2015    Past Surgical History:  Procedure Laterality Date  . ABDOMINAL HYSTERECTOMY  2008   Partial    Family History  Problem Relation Age of Onset  . Diabetes Mother   . Hyperlipidemia Father   . Hypertension Father   . Glaucoma Father   .  Hyperlipidemia Sister   . Hypertension Sister   . Asthma Sister   . Fibroids Sister     Social History   Socioeconomic History  . Marital status: Married    Spouse name: Mitzi Hansen  . Number of children: 2  . Years of education: Not on file  . Highest education level: Associate degree: academic program  Occupational History  . Not on file  Social Needs  . Financial resource strain: Not hard at all  . Food insecurity:    Worry: Never true    Inability: Never true  . Transportation needs:    Medical: No    Non-medical: No  Tobacco Use  . Smoking status: Never Smoker  . Smokeless tobacco: Never Used  Substance and Sexual Activity  . Alcohol use: Yes    Alcohol/week: 0.0 standard drinks  . Drug use: No  . Sexual activity: Yes    Partners: Male    Birth control/protection: Surgical  Lifestyle  . Physical activity:    Days per week: 3 days    Minutes per session: 30 min  . Stress: To some extent  Relationships  . Social connections:    Talks on phone: More than three times a week    Gets together: More than three times a week    Attends religious service: More than 4 times per year    Active member of club or organization: Yes    Attends meetings of clubs or organizations: More than 4 times per year    Relationship status: Married  . Intimate partner violence:    Fear of current or ex partner: No    Emotionally abused: No    Physically abused: No    Forced sexual activity: No  Other Topics Concern  . Not on file  Social History Narrative  . Not on file     Current Outpatient Medications:  .  ketorolac (TORADOL) 10 MG tablet, , Disp: , Rfl:  .  psyllium (METAMUCIL) 58.6 % packet, Take 1 packet by mouth daily., Disp: , Rfl:   No Known Allergies   ROS  Constitutional: Negative for fever or weight change.  Respiratory: Negative for cough and shortness of breath.   Cardiovascular: Negative for chest pain or palpitations.  Gastrointestinal: Negative for  abdominal pain, no bowel changes.  Musculoskeletal: Negative for gait problem or joint swelling.  Skin: Negative for rash.  Neurological: Negative for dizziness or headache.  No other specific complaints in a complete review of systems (except as listed in HPI above).  Objective  Vitals:  11/30/18 1539  BP: 128/84  Pulse: 88  Resp: 16  Temp: 97.8 F (36.6 C)  TempSrc: Oral  SpO2: 99%  Weight: 159 lb (72.1 kg)  Height: 5' 6"  (1.676 m)    Body mass index is 25.66 kg/m.  Physical Exam  Constitutional: Patient appears well-developed and well-nourished. No distress.  HENT: Head: Normocephalic and atraumatic. Ears: B TMs ok, no erythema or effusion; Nose: Nose normal. Mouth/Throat: Oropharynx is clear and moist. No oropharyngeal exudate.  Eyes: Conjunctivae and EOM are normal. Pupils are equal, round, and reactive to light. No scleral icterus.  Neck: Normal range of motion. Neck supple. No JVD present. No thyromegaly present.  Cardiovascular: Normal rate, regular rhythm and normal heart sounds.  No murmur heard. No BLE edema. Pulmonary/Chest: Effort normal and breath sounds normal. No respiratory distress. Abdominal: Soft. Bowel sounds are normal, no distension. There is no tenderness. no masses Breast: no lumps or masses, no nipple discharge or rashes FEMALE GENITALIA:  External genitalia normal External urethra normal Vaginal vault normal without discharge or lesions Cervix normal without discharge or lesions Bimanual exam normal without masses RECTAL: not done  Musculoskeletal: Normal range of motion, no joint effusions. No gross deformities Neurological: he is alert and oriented to person, place, and time. No cranial nerve deficit. Coordination, balance, strength, speech and gait are normal.  Skin: Skin is warm and dry. No rash noted. No erythema.  Psychiatric: Patient has a normal mood and affect. behavior is normal. Judgment and thought content  normal.  PHQ2/9: Depression screen [facility] 2/9 11/30/2018 03/01/2018 09/01/2017 07/28/2016 06/18/2015  Decreased Interest 0 0 0 0 0  Down, Depressed, Hopeless 0 0 0 0 0  PHQ - 2 Score 0 0 0 0 0  Altered sleeping 3 - - - -  Tired, decreased energy 1 - - - -  Change in appetite 0 - - - -  Feeling bad or failure about yourself  0 - - - -  Trouble concentrating 0 - - - -  Moving slowly or fidgety/restless 0 - - - -  Suicidal thoughts 0 - - - -  PHQ-9 Score 4 - - - -  Difficult doing work/chores Somewhat difficult - - - -   PHQ 9 not positive for depression, having difficulty staying asleep and will try otc [employer]   Fall Risk: Fall Risk  11/30/2018 03/01/2018 09/01/2017 07/28/2016 06/18/2015  Falls in the past year? 0 No No No No     Functional Status Survey: Is the patient deaf or have difficulty hearing?: No Does the patient have difficulty seeing, even when wearing glasses/contacts?: No Does the patient have difficulty concentrating, remembering, or making decisions?: No Does the patient have difficulty walking or climbing stairs?: No Does the patient have difficulty dressing or bathing?: No Does the patient have difficulty doing errands alone such as visiting a doctor's office or shopping?: No   Assessment & Plan  1. Well woman exam  - Lipid panel - CBC with Differential/Platelet - Comprehensive metabolic panel - Hemoglobin A1c  2. Cervical cancer screening  - Cytology - PAP  3. Screening for colorectal cancer  - Ambulatory referral to Gastroenterology   4. Need for hepatitis C screening test  - Hepatitis C Antibody  -USPSTF grade A and B recommendations reviewed with patient; age-appropriate recommendations, preventive care, screening tests, etc discussed and encouraged; healthy living encouraged; see AVS for patient education given to patient -Discussed importance of 150 minutes of physical activity weekly, eat two servings of  fish weekly, eat one serving of tree nuts (  cashews, pistachios, pecans, almonds.Marland Kitchen) every other day, eat 6 servings of fruit/vegetables daily and drink plenty of water and avoid sweet beverages.

## 2018-12-02 LAB — CYTOLOGY - PAP
DIAGNOSIS: NEGATIVE
HPV (WINDOPATH): NOT DETECTED

## 2018-12-26 ENCOUNTER — Encounter: Payer: Self-pay | Admitting: *Deleted

## 2019-03-07 ENCOUNTER — Encounter: Payer: 59 | Admitting: Family Medicine

## 2019-06-26 ENCOUNTER — Telehealth: Payer: Self-pay

## 2019-06-26 NOTE — Telephone Encounter (Signed)
Copied from [place] [phone]. Topic: General - Other >> Jun 26, 2019  2:38 PM Mcneil, Ja-Kwan wrote: Reason for CRM: Pt stated she would like a copy of the referral for the colonoscopy. Pt requests call back once the copy of the referral is ready for pick up.   Please let patient know that the requested item is ready for pick up

## 2019-06-26 NOTE — Telephone Encounter (Signed)
Called pt and she ask for me to mail a copy to her of her referral.

## 2019-06-27 NOTE — Telephone Encounter (Signed)
Ok thank you so very much.

## 2019-07-03 ENCOUNTER — Telehealth: Payer: Self-pay | Admitting: Family Medicine

## 2019-07-03 ENCOUNTER — Telehealth: Payer: Self-pay

## 2019-07-03 ENCOUNTER — Other Ambulatory Visit: Payer: Self-pay

## 2019-07-03 DIAGNOSIS — Z1211 Encounter for screening for malignant neoplasm of colon: Secondary | ICD-10-CM

## 2019-07-03 NOTE — Telephone Encounter (Signed)
Gastroenterology Pre-Procedure Review  Request Date: 07/21/19 Requesting Physician: Dr. Vicente Males  PATIENT REVIEW QUESTIONS: The patient responded to the following health history questions as indicated:    1. Are you having any GI issues? no 2. Do you have a personal history of Polyps? no 3. Do you have a family history of Colon Cancer or Polyps? no 4. Diabetes Mellitus? no 5. Joint replacements in the past 12 months?no 6. Major health problems in the past 3 months?no 7. Any artificial heart valves, MVP, or defibrillator?no    MEDICATIONS & ALLERGIES:    Patient reports the following regarding taking any anticoagulation/antiplatelet therapy:   Plavix, Coumadin, Eliquis, Xarelto, Lovenox, Pradaxa, Brilinta, or Effient? no Aspirin? no  Patient confirms/reports the following medications:  Current Outpatient Medications  Medication Sig Dispense Refill  . ketorolac (TORADOL) 10 MG tablet     . psyllium (METAMUCIL) 58.6 % packet Take 1 packet by mouth daily.     No current facility-administered medications for this visit.     Patient confirms/reports the following allergies:  No Known Allergies  No orders of the defined types were placed in this encounter.   AUTHORIZATION INFORMATION Primary Insurance: 1D#: Group #:  Secondary Insurance: 1D#: Group #:  SCHEDULE INFORMATION: Date: 07/21/19 Time: Location:[facility]

## 2019-07-03 NOTE — Telephone Encounter (Signed)
Copied from [place] [phone]. Topic: General - Other >> Jul 03, 2019  1:02 PM Keene Breath wrote: Reason for CRM: Patient called to check on a referral that was placed in March.  Patient's VM was full and they could not contact her.  Please call patient to discuss before the referral expires.  CB# [phone]

## 2019-07-03 NOTE — Telephone Encounter (Signed)
I called this patient and gave her the number to [facility]. I also sent an inbox message to Traci T.,  Informing her that this patient will be calling to setup her appointment.

## 2019-07-17 ENCOUNTER — Other Ambulatory Visit: Payer: Self-pay | Admitting: Family Medicine

## 2019-07-17 DIAGNOSIS — Z1231 Encounter for screening mammogram for malignant neoplasm of breast: Secondary | ICD-10-CM

## 2019-07-18 ENCOUNTER — Other Ambulatory Visit: Payer: Self-pay

## 2019-07-18 ENCOUNTER — Other Ambulatory Visit
Admission: RE | Admit: 2019-07-18 | Discharge: 2019-07-18 | Disposition: A | Discharge status: Home / Self Care | Payer: 59 | Source: Ambulatory Visit | Attending: Gastroenterology | Admitting: Gastroenterology

## 2019-07-18 ENCOUNTER — Ambulatory Visit
Admission: RE | Admit: 2019-07-18 | Discharge: 2019-07-18 | Disposition: A | Discharge status: Home / Self Care | Payer: 59 | Source: Ambulatory Visit | Attending: Family Medicine | Admitting: Family Medicine

## 2019-07-18 DIAGNOSIS — Z20828 Contact with and (suspected) exposure to other viral communicable diseases: Secondary | ICD-10-CM | POA: Diagnosis not present

## 2019-07-18 DIAGNOSIS — Z1231 Encounter for screening mammogram for malignant neoplasm of breast: Secondary | ICD-10-CM | POA: Diagnosis not present

## 2019-07-18 LAB — SARS CORONAVIRUS 2 (TAT 6-24 HRS): SARS Coronavirus 2: NEGATIVE

## 2019-07-21 ENCOUNTER — Encounter
Admission: RE | Disposition: A | Discharge status: Home / Self Care | Payer: Self-pay | Source: Home / Self Care | Attending: Gastroenterology

## 2019-07-21 ENCOUNTER — Ambulatory Visit
Admission: RE | Admit: 2019-07-21 | Discharge: 2019-07-21 | Disposition: A | Discharge status: Home / Self Care | Payer: 59 | Attending: Gastroenterology | Admitting: Gastroenterology

## 2019-07-21 ENCOUNTER — Ambulatory Visit: Payer: 59 | Admitting: Anesthesiology

## 2019-07-21 ENCOUNTER — Other Ambulatory Visit: Payer: Self-pay

## 2019-07-21 ENCOUNTER — Encounter: Payer: Self-pay | Admitting: *Deleted

## 2019-07-21 DIAGNOSIS — Z1211 Encounter for screening for malignant neoplasm of colon: Secondary | ICD-10-CM | POA: Diagnosis not present

## 2019-07-21 DIAGNOSIS — Z791 Long term (current) use of non-steroidal anti-inflammatories (NSAID): Secondary | ICD-10-CM | POA: Insufficient documentation

## 2019-07-21 DIAGNOSIS — K5730 Diverticulosis of large intestine without perforation or abscess without bleeding: Secondary | ICD-10-CM | POA: Insufficient documentation

## 2019-07-21 HISTORY — PX: COLONOSCOPY WITH PROPOFOL: SHX5780

## 2019-07-21 SURGERY — COLONOSCOPY WITH PROPOFOL
Anesthesia: General

## 2019-07-21 MED ORDER — PROPOFOL 500 MG/50ML IV EMUL
INTRAVENOUS | Status: DC | PRN
  Administered 2019-07-21: 150 ug/kg/min via INTRAVENOUS

## 2019-07-21 MED ORDER — SODIUM CHLORIDE 0.9 % IV SOLN
INTRAVENOUS | Status: DC
  Administered 2019-07-21: 08:00:00 via INTRAVENOUS

## 2019-07-21 MED ORDER — PROPOFOL 10 MG/ML IV BOLUS
INTRAVENOUS | Status: DC | PRN
  Administered 2019-07-21: 30 mg via INTRAVENOUS
  Administered 2019-07-21: 70 mg via INTRAVENOUS
  Administered 2019-07-21: 30 mg via INTRAVENOUS

## 2019-07-21 NOTE — Transfer of Care (Signed)
Immediate Anesthesia Transfer of Care Note  Patient: Chalese Peach  Procedure(s) Performed: COLONOSCOPY WITH PROPOFOL (N/A )  Patient Location: PACU  Anesthesia Type:General  Level of Consciousness: sedated  Airway & Oxygen Therapy: Patient Spontanous Breathing  Post-op Assessment: Report given to RN and Post -op Vital signs reviewed and stable  Post vital signs: Reviewed and stable  Last Vitals:  Vitals Value Taken Time  BP 112/78 07/21/19 0854  Temp 36.4 C 07/21/19 0854  Pulse 78 07/21/19 0854  Resp 21 07/21/19 0854  SpO2 99 % 07/21/19 0854    Last Pain:  Vitals:   07/21/19 0854  TempSrc: Tympanic  PainSc: 0-No pain         Complications: No apparent anesthesia complications

## 2019-07-21 NOTE — Anesthesia Preprocedure Evaluation (Signed)
Anesthesia Evaluation  Patient identified by MRN, date of birth, ID band Patient awake    Reviewed: Allergy & Precautions, H&P , NPO status , Patient's Chart, lab work & pertinent test results  History of Anesthesia Complications Negative for: history of anesthetic complications  Airway Mallampati: I  TM Distance: >3 FB Neck ROM: full    Dental  (+) Chipped   Pulmonary neg pulmonary ROS, neg shortness of breath,           Cardiovascular Exercise Tolerance: Good (-) angina(-) Past MI and (-) DOE negative cardio ROS       Neuro/Psych  Headaches, negative psych ROS   GI/Hepatic negative GI ROS, Neg liver ROS, neg GERD  ,  Endo/Other  negative endocrine ROS  Renal/GU negative Renal ROS  negative genitourinary   Musculoskeletal   Abdominal   Peds  Hematology negative hematology ROS (+)   Anesthesia Other Findings Past Medical History: No date: Chronic constipation No date: Diverticulitis No date: History of uterine fibroid  Past Surgical History: 2008: ABDOMINAL HYSTERECTOMY     Comment:  Partial  BMI    Body Mass Index: 24.28 kg/m      Reproductive/Obstetrics negative OB ROS                             Anesthesia Physical Anesthesia Plan  ASA: II  Anesthesia Plan: General   Post-op Pain Management:    Induction: Intravenous  PONV Risk Score and Plan: Propofol infusion and TIVA  Airway Management Planned: Natural Airway and Nasal Cannula  Additional Equipment:   Intra-op Plan:   Post-operative Plan:   Informed Consent: I have reviewed the patients History and Physical, chart, labs and discussed the procedure including the risks, benefits and alternatives for the proposed anesthesia with the patient or authorized representative who has indicated his/her understanding and acceptance.     Dental Advisory Given  Plan Discussed with: Anesthesiologist, CRNA and  Surgeon  Anesthesia Plan Comments: (Patient consented for risks of anesthesia including but not limited to:  - adverse reactions to medications - risk of intubation if required - damage to teeth, lips or other oral mucosa - sore throat or hoarseness - Damage to heart, brain, lungs or loss of life  Patient voiced understanding.)        Anesthesia Quick Evaluation

## 2019-07-21 NOTE — Anesthesia Postprocedure Evaluation (Signed)
Anesthesia Post Note  Patient: Donna Wyatt  Procedure(s) Performed: COLONOSCOPY WITH PROPOFOL (N/A )  Patient location during evaluation: Endoscopy Anesthesia Type: General Level of consciousness: awake and alert Pain management: pain level controlled Vital Signs Assessment: post-procedure vital signs reviewed and stable Respiratory status: spontaneous breathing, nonlabored ventilation, respiratory function stable and patient connected to nasal cannula oxygen Cardiovascular status: blood pressure returned to baseline and stable Postop Assessment: no apparent nausea or vomiting Anesthetic complications: no     Last Vitals:  Vitals:   07/21/19 0742 07/21/19 0854  BP: 134/83 112/78  Pulse: 65 78  Resp: 16 (!) 21  Temp: 36.6 C 36.4 C  SpO2: 100% 99%    Last Pain:  Vitals:   07/21/19 0854  TempSrc: Tympanic  PainSc: 0-No pain                 Precious Haws Gabrian Hoque

## 2019-07-21 NOTE — Anesthesia Post-op Follow-up Note (Signed)
Anesthesia QCDR form completed.        

## 2019-07-21 NOTE — H&P (Signed)
Jonathon Bellows, MD [address], [place], [place], [place], [address] [address], [place], [place], [place], [address] Phone: [phone]  Fax: [phone]  Primary Care Physician:  Steele Sizer, MD   Pre-Procedure History & Physical: HPI:  Donna Wyatt is a 48 y.o. female is here for an colonoscopy.   Past Medical History:  Diagnosis Date  . Chronic constipation   . Diverticulitis   . History of uterine fibroid     Past Surgical History:  Procedure Laterality Date  . ABDOMINAL HYSTERECTOMY  2008   Partial    Prior to Admission medications   Medication Sig Start Date End Date Taking? Authorizing Provider  ketorolac (TORADOL) 10 MG tablet  11/10/18  Yes [provider]  psyllium (METAMUCIL) 58.6 % packet Take 1 packet by mouth daily.   Yes [provider]    Allergies as of 07/03/2019  . (No Known Allergies)    Family History  Problem Relation Age of Onset  . Diabetes Mother   . Hyperlipidemia Father   . Hypertension Father   . Glaucoma Father   . Hyperlipidemia Sister   . Hypertension Sister   . Asthma Sister   . Fibroids Sister   . Breast cancer Neg Hx     Social History   Socioeconomic History  . Marital status: Married    Spouse name: Mitzi Hansen  . Number of children: 2  . Years of education: Not on file  . Highest education level: Associate degree: academic program  Occupational History  . Not on file  Social Needs  . Financial resource strain: Not hard at all  . Food insecurity    Worry: Never true    Inability: Never true  . Transportation needs    Medical: No    Non-medical: No  Tobacco Use  . Smoking status: Never Smoker  . Smokeless tobacco: Never Used  Substance and Sexual Activity  . Alcohol use: Yes    Alcohol/week: 0.0 standard drinks  . Drug use: No  . Sexual activity: Yes    Partners: Male    Birth control/protection: Surgical  Lifestyle  . Physical activity    Days per week: 3 days   Minutes per session: 30 min  . Stress: To some extent  Relationships  . Social connections    Talks on phone: More than three times a week    Gets together: More than three times a week    Attends religious service: More than 4 times per year    Active member of club or organization: Yes    Attends meetings of clubs or organizations: More than 4 times per year    Relationship status: Married  . Intimate partner violence    Fear of current or ex partner: No    Emotionally abused: No    Physically abused: No    Forced sexual activity: No  Other Topics Concern  . Not on file  Social History Narrative  . Not on file    Review of Systems: See HPI, otherwise negative ROS  Physical Exam: BP 134/83   Pulse 65   Temp 97.8 F (36.6 C) (Tympanic)   Resp 16   Ht 5\' 7"  (1.702 m)   Wt 70.3 kg   SpO2 100%   BMI 24.28 kg/m  General:   Alert,  pleasant and cooperative in NAD Head:  Normocephalic and atraumatic. Neck:  Supple; no masses or thyromegaly. Lungs:  Clear throughout to auscultation, normal respiratory effort.  Heart:  +S1, +S2, Regular rate and rhythm, No edema. Abdomen:  Soft, nontender and nondistended. Normal bowel sounds, without guarding, and without rebound.   Neurologic:  Alert and  oriented x4;  grossly normal neurologically.  Impression/Plan: Donna Wyatt is here for an colonoscopy to be performed for Screening colonoscopy average risk   Risks, benefits, limitations, and alternatives regarding  colonoscopy have been reviewed with the patient.  Questions have been answered.  All parties agreeable.   Wyline Mood, MD  07/21/2019, 8:31 AM

## 2019-07-21 NOTE — Op Note (Signed)
[facility] Patient Name: Donna Wyatt Procedure Date: 07/21/2019 8:34 AM MRN: [id number] Account #: [id number] Date of Birth: 04-Feb-1971 Admit Type: Outpatient Age: 48 Room: [facility] ENDO ROOM 4 Gender: Female Note Status: Finalized Procedure:            Colonoscopy Indications:          Screening for colorectal malignant neoplasm Providers:            Jonathon Bellows MD, MD Referring MD:         Bethena Roys. Sowles, MD (Referring MD) Medicines:            Monitored Anesthesia Care Complications:        No immediate complications. Procedure:            Pre-Anesthesia Assessment:                       - Prior to the procedure, a History and Physical was                        performed, and patient medications, allergies and                        sensitivities were reviewed. The patient's tolerance of                        previous anesthesia was reviewed.                       - The risks and benefits of the procedure and the                        sedation options and risks were discussed with the                        patient. All questions were answered and informed                        consent was obtained.                       - After reviewing the risks and benefits, the patient                        was deemed in satisfactory condition to undergo the                        procedure.                       - ASA Grade Assessment: II - A patient with mild                        systemic disease.                       After obtaining informed consent, the colonoscope was                        passed under direct vision. Throughout the procedure,  the patient's blood pressure, pulse, and oxygen                        saturations were monitored continuously. The                        Colonoscope was introduced through the anus and                        advanced to the the cecum, identified by the      appendiceal orifice. The colonoscopy was performed with                        ease. The patient tolerated the procedure well. The                        quality of the bowel preparation was excellent. Findings:      The perianal and digital rectal examinations were normal.      Multiple small-mouthed diverticula were found in the sigmoid colon.       There was no evidence of diverticular bleeding.      The exam was otherwise without abnormality on direct and retroflexion       views. Impression:           - Mild diverticulosis in the sigmoid colon. There was                        no evidence of diverticular bleeding.                       - The examination was otherwise normal on direct and                        retroflexion views.                       - No specimens collected. Recommendation:       - Discharge patient to home (with escort).                       - Resume previous diet.                       - Continue present medications.                       - Repeat colonoscopy in 10 years for screening purposes. Procedure Code(s):    --- Professional ---                       [id number], Colonoscopy, flexible; diagnostic, including                        collection of specimen(s) by brushing or washing, when                        performed (separate procedure) Diagnosis Code(s):    --- Professional ---                       Z12.11, Encounter for screening for malignant neoplasm  of colon                       K57.30, Diverticulosis of large intestine without                        perforation or abscess without bleeding CPT copyright 2019 American Medical Association. All rights reserved. The codes documented in this report are preliminary and upon coder review may  be revised to meet current compliance requirements. Wyline Mood, MD Wyline Mood MD, MD 07/21/2019 8:53:18 AM This report has been signed electronically. Number of Addenda: 0 Note Initiated  On: 07/21/2019 8:34 AM Scope Withdrawal Time: 0 hours 11 minutes 10 seconds  Total Procedure Duration: 0 hours 13 minutes 42 seconds  Estimated Blood Loss: Estimated blood loss: none.      [facility]

## 2019-07-24 ENCOUNTER — Encounter: Payer: Self-pay | Admitting: Gastroenterology

## 2019-12-05 ENCOUNTER — Other Ambulatory Visit: Payer: Self-pay

## 2019-12-05 ENCOUNTER — Ambulatory Visit (INDEPENDENT_AMBULATORY_CARE_PROVIDER_SITE_OTHER): Payer: 59 | Admitting: Family Medicine

## 2019-12-05 ENCOUNTER — Encounter: Payer: Self-pay | Admitting: Family Medicine

## 2019-12-05 VITALS — BP 136/94 | HR 91 | Temp 97.3°F | Resp 16 | Ht 65.75 in | Wt 159.8 lb

## 2019-12-05 DIAGNOSIS — G4700 Insomnia, unspecified: Secondary | ICD-10-CM

## 2019-12-05 DIAGNOSIS — R030 Elevated blood-pressure reading, without diagnosis of hypertension: Secondary | ICD-10-CM

## 2019-12-05 DIAGNOSIS — Z1159 Encounter for screening for other viral diseases: Secondary | ICD-10-CM

## 2019-12-05 DIAGNOSIS — Z131 Encounter for screening for diabetes mellitus: Secondary | ICD-10-CM

## 2019-12-05 DIAGNOSIS — Z13220 Encounter for screening for lipoid disorders: Secondary | ICD-10-CM

## 2019-12-05 DIAGNOSIS — Z0000 Encounter for general adult medical examination without abnormal findings: Secondary | ICD-10-CM | POA: Diagnosis not present

## 2019-12-05 MED ORDER — VITAMIN D 50 MCG (2000 UT) PO CAPS: 1.0000 | ORAL_CAPSULE | Freq: Every day | ORAL | 0 refills | Status: DC

## 2019-12-05 MED ORDER — TRAZODONE HCL 50 MG PO TABS: 25.0000 mg | ORAL_TABLET | Freq: Every evening | ORAL | 3 refills | Status: DC | PRN

## 2019-12-05 NOTE — Progress Notes (Signed)
Name: Donna Wyatt   MRN: [id number]    DOB: 06-12-1971   Date:12/05/2019       Progress Note  Subjective  Chief Complaint  Chief Complaint  Patient presents with  . Well woman exam  . Insomnia    Has trouble falling and staying asleep-will wake up after 2 to 3 hours of being asleep    HPI  Patient presents for annual CPE, Insomnia   Insomnia: she states symptoms started over one year ago, she has tried physical activity, melatonin , takes a long time to fall asleep ,  she only sleeps for about 3 hours and wakes up and takes over one hour to fall back asleep . Always feeling tired and feels sleepy during the day. Mind does not wonder, just cannot sleep. She is practicing good sleep hygiene. Discussed options we will start with trazodone and titrate to up to 100 mg at night   Elevated bp: no history of hypertension, we will check labs, advised to monitor bp, follow a dash diet and increase physical activity  Diet: she likes chips Exercise: not currently, advised 150 minutes of physical activity per week   USPSTF grade A and B recommendations    Office Visit from 12/05/2019 in [facility]  AUDIT-C Score  1     Depression: Phq 9 is  negative Depression screen [facility] 2/9 12/05/2019 11/30/2018 03/01/2018 09/01/2017 07/28/2016  Decreased Interest 0 0 0 0 0  Down, Depressed, Hopeless 0 0 0 0 0  PHQ - 2 Score 0 0 0 0 0  Altered sleeping 3 3 - - -  Tired, decreased energy 3 1 - - -  Change in appetite 0 0 - - -  Feeling bad or failure about yourself  0 0 - - -  Trouble concentrating 0 0 - - -  Moving slowly or fidgety/restless 0 0 - - -  Suicidal thoughts 0 0 - - -  PHQ-9 Score 6 4 - - -  Difficult doing work/chores Somewhat difficult Somewhat difficult - - -   Hypertension: BP Readings from Last 3 Encounters:  12/05/19 (!) 136/94  07/21/19 112/78  11/30/18 128/84   Obesity: Wt Readings from Last 3 Encounters:  12/05/19 159 lb 12.8 oz (72.5 kg)  07/21/19 155 lb  (70.3 kg)  11/30/18 159 lb (72.1 kg)   BMI Readings from Last 3 Encounters:  12/05/19 25.99 kg/m  07/21/19 24.28 kg/m  11/30/18 25.66 kg/m     Hep C Screening: today  STD testing and prevention (HIV/chl/gon/syphilis): N/A Intimate partner violence: negative screen  Sexual History (Partners/Practices/Protection from [employer] hx STI/Pregnancy Plans):married  Pain during Intercourse: no problems  Menstrual History/LMP/Abnormal Bleeding: hysterectomy for fibroids  Incontinence Symptoms: starting to have some stress incontinence - usually when laughing. Advised to bladder training   Breast cancer:  - Last Mammogram: yearly  - BRCA gene screening: N/A  Osteoporosis: Discussed high calcium and vitamin D supplementation, weight bearing exercises  Cervical cancer screening: up to date   Skin cancer: Discussed monitoring for atypical lesions  Colorectal cancer: is up to date, had colonoscopy in 2020 repeat in 10 years  Lung cancer:   Low Dose CT Chest recommended if Age 39-80 years, 30 pack-year currently smoking OR have quit w/in 15years. Patient does not qualify.   ECG: 2011  Advanced Care Planning: A voluntary discussion about advance care planning including the explanation and discussion of advance directives.  Discussed health care proxy and Living will, and the patient  was able to identify a health care proxy as husband .  Patient does have a living will at present time. If patient does have living will, I have requested they bring this to the clinic to be scanned in to their chart.  Lipids: Lab Results  Component Value Date   CHOL 206 (H) 03/01/2018   Lab Results  Component Value Date   HDL 59 03/01/2018   Lab Results  Component Value Date   LDLCALC 119 (H) 03/01/2018   Lab Results  Component Value Date   TRIG 165 (H) 03/01/2018   Lab Results  Component Value Date   CHOLHDL 3.5 03/01/2018   No results found for: LDLDIRECT  Glucose: Glucose  Date Value Ref  Range Status  08/26/2013 96 65 - 99 mg/dL Final   Glucose, Bld  Date Value Ref Range Status  03/01/2018 88 65 - 139 mg/dL Final    Comment:    .        Non-fasting reference interval .   09/15/2017 105 (H) 65 - 99 mg/dL Final  04/29/2016 94 65 - 99 mg/dL Final    Patient Active Problem List   Diagnosis Date Noted  . Diverticulosis 06/18/2015  . History of hysterectomy, supracervical 06/18/2015  . Breast lump in female 06/17/2015  . Chronic constipation 06/17/2015  . Cephalalgia 06/17/2015    Past Surgical History:  Procedure Laterality Date  . ABDOMINAL HYSTERECTOMY  2008   Partial  . COLONOSCOPY WITH PROPOFOL N/A 07/21/2019   Procedure: COLONOSCOPY WITH PROPOFOL;  Surgeon: Jonathon Bellows, MD;  Location: [facility]COPY;  Service: Gastroenterology;  Laterality: N/A;    Family History  Problem Relation Age of Onset  . Diabetes Mother   . Hyperlipidemia Father   . Hypertension Father   . Glaucoma Father   . Hyperlipidemia Sister   . Hypertension Sister   . Asthma Sister   . Fibroids Sister   . Breast cancer Neg Hx     Social History   Socioeconomic History  . Marital status: Married    Spouse name: Mitzi Hansen  . Number of children: 2  . Years of education: Not on file  . Highest education level: Associate degree: academic program  Occupational History  . Not on file  Tobacco Use  . Smoking status: Never Smoker  . Smokeless tobacco: Never Used  Substance and Sexual Activity  . Alcohol use: Yes    Alcohol/week: 0.0 standard drinks    Comment: occasionally  . Drug use: No  . Sexual activity: Yes    Partners: Male    Birth control/protection: Surgical  Other Topics Concern  . Not on file  Social History Narrative  . Not on file   Social Determinants of Health   Financial Resource Strain: Low Risk   . Difficulty of Paying Living Expenses: Not hard at all  Food Insecurity: No Food Insecurity  . Worried About Charity fundraiser in the Last Year: Never true   . Ran Out of Food in the Last Year: Never true  Transportation Needs: No Transportation Needs  . Lack of Transportation (Medical): No  . Lack of Transportation (Non-Medical): No  Physical Activity: Inactive  . Days of Exercise per Week: 0 days  . Minutes of Exercise per Session: 0 min  Stress: Stress Concern Present  . Feeling of Stress : Very much  Social Connections: Somewhat Isolated  . Frequency of Communication with Friends and Family: More than three times a week  . Frequency of Social  Gatherings with Friends and Family: Once a week  . Attends Religious Services: Never  . Active Member of Clubs or Organizations: No  . Attends Archivist Meetings: Never  . Marital Status: Married  Human resources officer Violence: Not At Risk  . Fear of Current or Ex-Partner: No  . Emotionally Abused: No  . Physically Abused: No  . Sexually Abused: No     Current Outpatient Medications:  .  ketorolac (TORADOL) 10 MG tablet, Take 10 mg by mouth every 8 (eight) hours as needed. , Disp: , Rfl:  .  psyllium (METAMUCIL) 58.6 % packet, Take 1 packet by mouth daily., Disp: , Rfl:   No Known Allergies   ROS  Constitutional: Negative for fever or weight change.  Respiratory: Negative for cough and shortness of breath.   Cardiovascular: Negative for chest pain or palpitations.  Gastrointestinal: Negative for abdominal pain, no bowel changes.  Musculoskeletal: Negative for gait problem or joint swelling.  Skin: Negative for rash.  Neurological: Negative for dizziness or headache.  No other specific complaints in a complete review of systems (except as listed in HPI above).  Objective  Vitals:   12/05/19 1526 12/05/19 1535  BP: 140/90 (!) 136/94  Pulse: 91   Resp: 16   Temp: (!) 97.3 F (36.3 C)   TempSrc: Temporal   SpO2: 99%   Weight: 159 lb 12.8 oz (72.5 kg)   Height: 5' 5.75" (1.67 m)     Body mass index is 25.99 kg/m.  Physical Exam  Constitutional: Patient appears  well-developed and well-nourished. No distress.  HENT: Head: Normocephalic and atraumatic. Ears: B TMs ok, no erythema or effusion; Nose: Nose normal. Mouth/Throat: Oropharynx is clear and moist. No oropharyngeal exudate.  Eyes: Conjunctivae and EOM are normal. Pupils are equal, round, and reactive to light. No scleral icterus.  Neck: Normal range of motion. Neck supple. No JVD present. No thyromegaly present.  Cardiovascular: Normal rate, regular rhythm and normal heart sounds.  No murmur heard. No BLE edema. Pulmonary/Chest: Effort normal and breath sounds normal. No respiratory distress. Abdominal: Soft. Bowel sounds are normal, no distension. There is no tenderness. no masses Breast: no lumps or masses, no nipple discharge or rashes FEMALE GENITALIA:  External genitalia normal External urethra normal Vaginal vault normal without discharge or lesions Cervix normal without discharge or lesions Bimanual exam normal without masses RECTAL: not done Musculoskeletal: Normal range of motion, no joint effusions. No gross deformities Neurological: he is alert and oriented to person, place, and time. No cranial nerve deficit. Coordination, balance, strength, speech and gait are normal.  Skin: Skin is warm and dry. No rash noted. No erythema.  Psychiatric: Patient has a normal mood and affect. behavior is normal. Judgment and thought content normal.  Fall Risk: Fall Risk  12/05/2019 11/30/2018 03/01/2018 09/01/2017 07/28/2016  Falls in the past year? 0 0 No No No  Number falls in past yr: 0 - - - -  Injury with Fall? 0 - - - -     Functional Status Survey: Is the patient deaf or have difficulty hearing?: No Does the patient have difficulty seeing, even when wearing glasses/contacts?: No Does the patient have difficulty concentrating, remembering, or making decisions?: No Does the patient have difficulty walking or climbing stairs?: No Does the patient have difficulty dressing or bathing?: No Does  the patient have difficulty doing errands alone such as visiting a doctor's office or shopping?: No   Assessment & Plan   1. Well adult  exam  - Cholecalciferol (VITAMIN D) 50 MCG (2000 UT) CAPS; Take 1 capsule (2,000 Units total) by mouth daily.  Dispense: 30 capsule; Refill: 0 - Lipid panel - CBC with Differential/Platelet - COMPLETE METABOLIC PANEL WITH GFR - TSH - Hemoglobin A1c - Hepatitis C antibody  2. Need for hepatitis C screening test  - Hepatitis C antibody  3. Screening for diabetes mellitus  - Hemoglobin A1c  4. Elevated blood pressure reading  - CBC with Differential/Platelet - COMPLETE METABOLIC PANEL WITH GFR - TSH  5. Lipid screening  - Lipid panel  6. Persistent insomnia  - traZODone (DESYREL) 50 MG tablet; Take 0.5-2 tablets (25-100 mg total) by mouth at bedtime as needed for sleep.  Dispense: 30 tablet; Refill: 3  -USPSTF grade A and B recommendations reviewed with patient; age-appropriate recommendations, preventive care, screening tests, etc discussed and encouraged; healthy living encouraged; see AVS for patient education given to patient -Discussed importance of 150 minutes of physical activity weekly, eat two servings of fish weekly, eat one serving of tree nuts ( cashews, pistachios, pecans, almonds.Marland Kitchen) every other day, eat 6 servings of fruit/vegetables daily and drink plenty of water and avoid sweet beverages.

## 2019-12-05 NOTE — Patient Instructions (Addendum)
Preventive Care 49-49 Years Old, Female Preventive care refers to visits with your health care provider and lifestyle choices that can promote health and wellness. This includes:  A yearly physical exam. This may also be called an annual well check.  Regular dental visits and eye exams.  Immunizations.  Screening for certain conditions.  Healthy lifestyle choices, such as eating a healthy diet, getting regular exercise, not using drugs or products that contain nicotine and tobacco, and limiting alcohol use. What can I expect for my preventive care visit? Physical exam Your health care provider will check your:  Height and weight. This may be used to calculate body mass index (BMI), which tells if you are at a healthy weight.  Heart rate and blood pressure.  Skin for abnormal spots. Counseling Your health care provider may ask you questions about your:  Alcohol, tobacco, and drug use.  Emotional well-being.  Home and relationship well-being.  Sexual activity.  Eating habits.  Work and work environment.  Method of birth control.  Menstrual cycle.  Pregnancy history. What immunizations do I need?  Influenza (flu) vaccine  This is recommended every year. Tetanus, diphtheria, and pertussis (Tdap) vaccine  You may need a Td booster every 10 years. Varicella (chickenpox) vaccine  You may need this if you have not been vaccinated. Zoster (shingles) vaccine  You may need this after age 49. Measles, mumps, and rubella (MMR) vaccine  You may need at least one dose of MMR if you were born in 1957 or later. You may also need a second dose. Pneumococcal conjugate (PCV13) vaccine  You may need this if you have certain conditions and were not previously vaccinated. Pneumococcal polysaccharide (PPSV23) vaccine  You may need one or two doses if you smoke cigarettes or if you have certain conditions. Meningococcal conjugate (MenACWY) vaccine  You may need this if you  have certain conditions. Hepatitis A vaccine  You may need this if you have certain conditions or if you travel or work in places where you may be exposed to hepatitis A. Hepatitis B vaccine  You may need this if you have certain conditions or if you travel or work in places where you may be exposed to hepatitis B. Haemophilus influenzae type b (Hib) vaccine  You may need this if you have certain conditions. Human papillomavirus (HPV) vaccine  If recommended by your health care provider, you may need three doses over 6 months. You may receive vaccines as individual doses or as more than one vaccine together in one shot (combination vaccines). Talk with your health care provider about the risks and benefits of combination vaccines. What tests do I need? Blood tests  Lipid and cholesterol levels. These may be checked every 5 years, or more frequently if you are over 50 years old.  Hepatitis C test.  Hepatitis B test. Screening  Lung cancer screening. You may have this screening every year starting at age 49 if you have a 30-pack-year history of smoking and currently smoke or have quit within the past 15 years.  Colorectal cancer screening. All adults should have this screening starting at age 49 and continuing until age 75. Your health care provider may recommend screening at age 49 if you are at increased risk. You will have tests every 1-10 years, depending on your results and the type of screening test.  Diabetes screening. This is done by checking your blood sugar (glucose) after you have not eaten for a while (fasting). You may have this   done every 1-3 years.  Mammogram. This may be done every 1-2 years. Talk with your health care provider about when you should start having regular mammograms. This may depend on whether you have a family history of breast cancer.  BRCA-related cancer screening. This may be done if you have a family history of breast, ovarian, tubal, or peritoneal  cancers.  Pelvic exam and Pap test. This may be done every 3 years starting at age 49. Starting at age 49, this may be done every 5 years if you have a Pap test in combination with an HPV test. Other tests  Sexually transmitted disease (STD) testing.  Bone density scan. This is done to screen for osteoporosis. You may have this scan if you are at high risk for osteoporosis. Follow these instructions at home: Eating and drinking  Eat a diet that includes fresh fruits and vegetables, whole grains, lean protein, and low-fat dairy.  Take vitamin and mineral supplements as recommended by your health care provider.  Do not drink alcohol if: ? Your health care provider tells you not to drink. ? You are pregnant, may be pregnant, or are planning to become pregnant.  If you drink alcohol: ? Limit how much you have to 0-1 drink a day. ? Be aware of how much alcohol is in your drink. In the [place] one drink equals one 12 oz bottle of beer (355 mL), one 5 oz glass of wine (148 mL), or one 1 oz glass of hard liquor (44 mL). Lifestyle  Take daily care of your teeth and gums.  Stay active. Exercise for at least 30 minutes on 5 or more days each week.  Do not use any products that contain nicotine or tobacco, such as cigarettes, e-cigarettes, and chewing tobacco. If you need help quitting, ask your health care provider.  If you are sexually active, practice safe sex. Use a condom or other form of birth control (contraception) in order to prevent pregnancy and STIs (sexually transmitted infections).  If told by your health care provider, take low-dose aspirin daily starting at age 49. What's next?  Visit your health care provider once a year for a well check visit.  Ask your health care provider how often you should have your eyes and teeth checked.  Stay up to date on all vaccines. This information is not intended to replace advice given to you by your health care provider. Make sure you  discuss any questions you have with your health care provider. Document Revised: 05/26/2018 Document Reviewed: 05/26/2018 Elsevier Patient Education  2020 [employer]  Preventive Care 58-78 Years Old, Female Preventive care refers to visits with your health care provider and lifestyle choices that can promote health and wellness. This includes:  A yearly physical exam. This may also be called an annual well check.  Regular dental visits and eye exams.  Immunizations.  Screening for certain conditions.  Healthy lifestyle choices, such as eating a healthy diet, getting regular exercise, not using drugs or products that contain nicotine and tobacco, and limiting alcohol use. What can I expect for my preventive care visit? Physical exam Your health care provider will check your:  Height and weight. This may be used to calculate body mass index (BMI), which tells if you are at a healthy weight.  Heart rate and blood pressure.  Skin for abnormal spots. Counseling Your health care provider may ask you questions about your:  Alcohol, tobacco, and drug use.  Emotional well-being.  Home and  relationship well-being.  Sexual activity.  Eating habits.  Work and work Statistician.  Method of birth control.  Menstrual cycle.  Pregnancy history. What immunizations do I need?  Influenza (flu) vaccine  This is recommended every year. Tetanus, diphtheria, and pertussis (Tdap) vaccine  You may need a Td booster every 10 years. Varicella (chickenpox) vaccine  You may need this if you have not been vaccinated. Zoster (shingles) vaccine  You may need this after age 67. Measles, mumps, and rubella (MMR) vaccine  You may need at least one dose of MMR if you were born in 1957 or later. You may also need a second dose. Pneumococcal conjugate (PCV13) vaccine  You may need this if you have certain conditions and were not previously vaccinated. Pneumococcal polysaccharide  (PPSV23) vaccine  You may need one or two doses if you smoke cigarettes or if you have certain conditions. Meningococcal conjugate (MenACWY) vaccine  You may need this if you have certain conditions. Hepatitis A vaccine  You may need this if you have certain conditions or if you travel or work in places where you may be exposed to hepatitis A. Hepatitis B vaccine  You may need this if you have certain conditions or if you travel or work in places where you may be exposed to hepatitis B. Haemophilus influenzae type b (Hib) vaccine  You may need this if you have certain conditions. Human papillomavirus (HPV) vaccine  If recommended by your health care provider, you may need three doses over 6 months. You may receive vaccines as individual doses or as more than one vaccine together in one shot (combination vaccines). Talk with your health care provider about the risks and benefits of combination vaccines. What tests do I need? Blood tests  Lipid and cholesterol levels. These may be checked every 5 years, or more frequently if you are over 4 years old.  Hepatitis C test.  Hepatitis B test. Screening  Lung cancer screening. You may have this screening every year starting at age 39 if you have a 30-pack-year history of smoking and currently smoke or have quit within the past 15 years.  Colorectal cancer screening. All adults should have this screening starting at age 65 and continuing until age 35. Your health care provider may recommend screening at age 67 if you are at increased risk. You will have tests every 1-10 years, depending on your results and the type of screening test.  Diabetes screening. This is done by checking your blood sugar (glucose) after you have not eaten for a while (fasting). You may have this done every 1-3 years.  Mammogram. This may be done every 1-2 years. Talk with your health care provider about when you should start having regular mammograms. This may  depend on whether you have a family history of breast cancer.  BRCA-related cancer screening. This may be done if you have a family history of breast, ovarian, tubal, or peritoneal cancers.  Pelvic exam and Pap test. This may be done every 3 years starting at age 57. Starting at age 46, this may be done every 5 years if you have a Pap test in combination with an HPV test. Other tests  Sexually transmitted disease (STD) testing.  Bone density scan. This is done to screen for osteoporosis. You may have this scan if you are at high risk for osteoporosis. Follow these instructions at home: Eating and drinking  Eat a diet that includes fresh fruits and vegetables, whole grains, lean protein, and  low-fat dairy.  Take vitamin and mineral supplements as recommended by your health care provider.  Do not drink alcohol if: ? Your health care provider tells you not to drink. ? You are pregnant, may be pregnant, or are planning to become pregnant.  If you drink alcohol: ? Limit how much you have to 0-1 drink a day. ? Be aware of how much alcohol is in your drink. In the [place] one drink equals one 12 oz bottle of beer (355 mL), one 5 oz glass of wine (148 mL), or one 1 oz glass of hard liquor (44 mL). Lifestyle  Take daily care of your teeth and gums.  Stay active. Exercise for at least 30 minutes on 5 or more days each week.  Do not use any products that contain nicotine or tobacco, such as cigarettes, e-cigarettes, and chewing tobacco. If you need help quitting, ask your health care provider.  If you are sexually active, practice safe sex. Use a condom or other form of birth control (contraception) in order to prevent pregnancy and STIs (sexually transmitted infections).  If told by your health care provider, take low-dose aspirin daily starting at age 52. What's next?  Visit your health care provider once a year for a well check visit.  Ask your health care provider how often you should  have your eyes and teeth checked.  Stay up to date on all vaccines. This information is not intended to replace advice given to you by your health care provider. Make sure you discuss any questions you have with your health care provider. Document Revised: 05/26/2018 Document Reviewed: 05/26/2018 Elsevier Patient Education  2020 [employer].

## 2020-03-28 DIAGNOSIS — Z20822 Contact with and (suspected) exposure to covid-19: Secondary | ICD-10-CM | POA: Diagnosis not present

## 2020-07-22 ENCOUNTER — Other Ambulatory Visit: Payer: Self-pay

## 2020-07-22 ENCOUNTER — Ambulatory Visit: Payer: 59 | Admitting: Family Medicine

## 2020-07-22 ENCOUNTER — Encounter: Payer: Self-pay | Admitting: Family Medicine

## 2020-07-22 ENCOUNTER — Other Ambulatory Visit: Payer: Self-pay | Admitting: Family Medicine

## 2020-07-22 VITALS — BP 122/78 | HR 84 | Temp 98.4°F | Resp 16 | Ht 66.0 in | Wt 158.9 lb

## 2020-07-22 DIAGNOSIS — F419 Anxiety disorder, unspecified: Secondary | ICD-10-CM | POA: Diagnosis not present

## 2020-07-22 DIAGNOSIS — Z131 Encounter for screening for diabetes mellitus: Secondary | ICD-10-CM | POA: Diagnosis not present

## 2020-07-22 DIAGNOSIS — Z13220 Encounter for screening for lipoid disorders: Secondary | ICD-10-CM

## 2020-07-22 DIAGNOSIS — F338 Other recurrent depressive disorders: Secondary | ICD-10-CM

## 2020-07-22 DIAGNOSIS — Z1159 Encounter for screening for other viral diseases: Secondary | ICD-10-CM | POA: Diagnosis not present

## 2020-07-22 DIAGNOSIS — Z1231 Encounter for screening mammogram for malignant neoplasm of breast: Secondary | ICD-10-CM | POA: Diagnosis not present

## 2020-07-22 DIAGNOSIS — R61 Generalized hyperhidrosis: Secondary | ICD-10-CM

## 2020-07-22 DIAGNOSIS — G4700 Insomnia, unspecified: Secondary | ICD-10-CM | POA: Diagnosis not present

## 2020-07-22 MED ORDER — TRAZODONE HCL 50 MG PO TABS: 50.0000 mg | ORAL_TABLET | Freq: Every evening | ORAL | 0 refills | Status: DC | PRN

## 2020-07-22 MED ORDER — ESCITALOPRAM OXALATE 10 MG PO TABS: 10.0000 mg | ORAL_TABLET | Freq: Every day | ORAL | 0 refills | Status: DC

## 2020-07-22 NOTE — Progress Notes (Signed)
Name: Donna Wyatt   MRN: [id number]    DOB: [birth year]   Date:07/22/2020       Progress Note  Subjective  Chief Complaint  Chief Complaint  Patient presents with  . Anxiety  . Menopause    concentration, night sweats, foggy brain    HPI  Depression/Anxiety: she states since the beginning of 2021 she noticed difficulty sleeping, it got progressively worse. 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 She states she has been doing the self study and is unable to focus. She has a history of seasonal affective disorder but never took medication because she was able to cope with it. She is now avoiding going out with friends, no motivation, no longer getting pedicures. She states her husband noticed she is edgy and her 55 you twin children are not home all the time so they have noticed any changes. She has a stressful job but nobody has noticed a change at work. She had a partial hysterectomy and over the past few months she has noticed night sweats. Her mind is still busy and takes a while for her to fall asleep. She has tried Trazodone and it helps a little, she is sleeping from 3 hours per night to 4-5 hours per night.   Patient Active Problem List   Diagnosis Date Noted  . Diverticulosis 06/18/2015  . History of hysterectomy, supracervical 06/18/2015  . Breast lump in female 06/17/2015  . Chronic constipation 06/17/2015  . Cephalalgia 06/17/2015    Past Surgical History:  Procedure Laterality Date  . ABDOMINAL HYSTERECTOMY  2008   Partial  . COLONOSCOPY WITH PROPOFOL N/A 07/21/2019   Procedure: COLONOSCOPY WITH PROPOFOL;  Surgeon: Wyline Mood, MD;  Location[facility] ENDOSCOPY;  Service: Gastroenterology;  Laterality: N/A;    Family History  Problem Relation Age of Onset  . Diabetes Mother   . Hyperlipidemia Father   . Hypertension Father    . Glaucoma Father   . Hyperlipidemia Sister   . Hypertension Sister   . Asthma Sister   . Fibroids Sister   . Breast cancer Neg Hx     Social History   Tobacco Use  . Smoking status: Never Smoker  . Smokeless tobacco: Never Used  Substance Use Topics  . Alcohol use: Yes    Alcohol/week: 0.0 standard drinks    Comment: occasionally     Current Outpatient Medications:  .  Cholecalciferol (VITAMIN D) 50 MCG (2000 UT) CAPS, Take 1 capsule (2,000 Units total) by mouth daily., Disp: 30 capsule, Rfl: 0 .  traZODone (DESYREL) 50 MG tablet, Take 0.5-2 tablets (25-100 mg total) by mouth at bedtime as needed for sleep., Disp: 30 tablet, Rfl: 3  No Known Allergies  I personally reviewed active problem list, medication list, allergies, family history, social history, health maintenance with the patient/caregiver today.   ROS  Constitutional: Negative for fever or weight change.  Respiratory: Negative for cough and shortness of breath.   Cardiovascular: Negative for chest pain or palpitations.  Gastrointestinal: Negative for abdominal pain, no bowel changes.  Musculoskeletal: Negative for gait problem or joint swelling.  Skin: Negative for rash.  Neurological: Negative for dizziness or headache.  No other specific complaints in a complete review of systems (except as listed in HPI above).  Objective  Vitals:   07/22/20 0747  BP: 122/78  Pulse: 84  Resp: 16  Temp: 98.4 F (36.9 C)  TempSrc: Oral  SpO2: 96%  Weight: 158 lb 14.4 oz (72.1 kg)  Height: 5\' 6"  (1.676 m)    Body mass index is 25.65 kg/m.  Physical Exam  Constitutional: Patient appears well-developed and well-nourished.  No distress.  HEENT: head atraumatic, normocephalic, pupils equal and reactive to light,neck supple Cardiovascular: Normal rate, regular rhythm and normal heart sounds.  No murmur heard. No BLE edema. Pulmonary/Chest: Effort normal and breath sounds normal. No respiratory  distress. Abdominal: Soft.  There is no tenderness. Psychiatric: Patient has a normal mood and affect. behavior is normal. Judgment and thought content normal. She cried for a bit   PHQ2/9: Depression screen [facility] 2/9 07/22/2020 07/22/2020 12/05/2019 11/30/2018 03/01/2018  Decreased Interest 2 0 0 0 0  Down, Depressed, Hopeless 2 0 0 0 0  PHQ - 2 Score 4 0 0 0 0  Altered sleeping 2 - 3 3 -  Tired, decreased energy 2 - 3 1 -  Change in appetite 0 - 0 0 -  Feeling bad or failure about yourself  2 - 0 0 -  Trouble concentrating 2 - 0 0 -  Moving slowly or fidgety/restless 0 - 0 0 -  Suicidal thoughts 0 - 0 0 -  PHQ-9 Score 12 - 6 4 -  Difficult doing work/chores Somewhat difficult - Somewhat difficult Somewhat difficult -    phq 9 is positive    Fall Risk: Fall Risk  07/22/2020 12/05/2019 11/30/2018 03/01/2018 09/01/2017  Falls in the past year? 0 0 0 No No  Number falls in past yr: 0 0 - - -  Injury with Fall? 0 0 - - -  Follow up Falls evaluation completed - - - -     Assessment & Plan  1. Anxiety  - CBC with Differential/Platelet - COMPLETE METABOLIC PANEL WITH GFR - TSH - escitalopram (LEXAPRO) 10 MG tablet; Take 1 tablet (10 mg total) by mouth daily.  Dispense: 30 tablet; Refill: 0  2. Seasonal affective disorder (HCC)  - escitalopram (LEXAPRO) 10 MG tablet; Take 1 tablet (10 mg total) by mouth daily.  Dispense: 30 tablet; Refill: 0  3. Breast cancer screening by mammogram  - MM 3D SCREEN BREAST BILATERAL; Future  4. Need for hepatitis C screening test  - Hepatitis C antibody  5. Lipid screening  - Lipid panel  6. Screening for diabetes mellitus  - Hemoglobin A1c  7. Night sweats  - FSH/LH - escitalopram (LEXAPRO) 10 MG tablet; Take 1 tablet (10 mg total) by mouth daily.  Dispense: 30 tablet; Refill: 0  8.Persistent Insomnia  - traZODone (DESYREL) 50 MG tablet; Take 1 tablet (50 mg total) by mouth at bedtime as needed for sleep.  Dispense: 30 tablet; Refill: 0

## 2020-07-23 LAB — HEPATITIS C ANTIBODY
Hepatitis C Ab: NONREACTIVE
SIGNAL TO CUT-OFF: 0.02 (ref <1.00)

## 2020-07-23 LAB — LIPID PANEL
Cholesterol: 203 mg/dL — ABNORMAL HIGH (ref <200)
HDL: 59 mg/dL (ref >=50)
LDL Cholesterol (Calc): 125 mg/dL (calc) — ABNORMAL HIGH
Non-HDL Cholesterol (Calc): 144 mg/dL (calc) — ABNORMAL HIGH (ref <130)
Total CHOL/HDL Ratio: 3.4 (calc) (ref <5.0)
Triglycerides: 90 mg/dL (ref <150)

## 2020-07-23 LAB — CBC WITH DIFFERENTIAL/PLATELET
Absolute Monocytes: 469 cells/uL (ref 200–950)
Basophils Absolute: 47 cells/uL (ref 0–200)
Basophils Relative: 0.7 %
Eosinophils Absolute: 302 cells/uL (ref 15–500)
Eosinophils Relative: 4.5 %
HCT: 42 % (ref 35.0–45.0)
Hemoglobin: 14 g/dL (ref 11.7–15.5)
Lymphs Abs: 1903 cells/uL (ref 850–3900)
MCH: 29.9 pg (ref 27.0–33.0)
MCHC: 33.3 g/dL (ref 32.0–36.0)
MCV: 89.6 fL (ref 80.0–100.0)
MPV: 10.7 fL (ref 7.5–12.5)
Monocytes Relative: 7 %
Neutro Abs: 3980 cells/uL (ref 1500–7800)
Neutrophils Relative %: 59.4 %
Platelets: 276 10*3/uL (ref 140–400)
RBC: 4.69 10*6/uL (ref 3.80–5.10)
RDW: 11.6 % (ref 11.0–15.0)
Total Lymphocyte: 28.4 %
WBC: 6.7 10*3/uL (ref 3.8–10.8)

## 2020-07-23 LAB — COMPLETE METABOLIC PANEL WITH GFR
AG Ratio: 1.6 (calc) (ref 1.0–2.5)
ALT: 12 U/L (ref 6–29)
AST: 12 U/L (ref 10–35)
Albumin: 4.2 g/dL (ref 3.6–5.1)
Alkaline phosphatase (APISO): 83 U/L (ref 31–125)
BUN: 13 mg/dL (ref 7–25)
CO2: 27 mmol/L (ref 20–32)
Calcium: 9.6 mg/dL (ref 8.6–10.2)
Chloride: 108 mmol/L (ref 98–110)
Creat: 0.77 mg/dL (ref 0.50–1.10)
GFR, Est African American: 105 mL/min/{1.73_m2} (ref >=60)
GFR, Est Non African American: 91 mL/min/{1.73_m2} (ref >=60)
Globulin: 2.7 g/dL (calc) (ref 1.9–3.7)
Glucose, Bld: 86 mg/dL (ref 65–99)
Potassium: 3.8 mmol/L (ref 3.5–5.3)
Sodium: 140 mmol/L (ref 135–146)
Total Bilirubin: 0.6 mg/dL (ref 0.2–1.2)
Total Protein: 6.9 g/dL (ref 6.1–8.1)

## 2020-07-23 LAB — TSH: TSH: 2.26 mIU/L

## 2020-07-23 LAB — HEMOGLOBIN A1C
Hgb A1c MFr Bld: 4.6 % of total Hgb (ref <5.7)
Mean Plasma Glucose: 85 (calc)
eAG (mmol/L): 4.7 (calc)

## 2020-07-23 LAB — FSH/LH
FSH: 21.2 m[IU]/mL
LH: 25 m[IU]/mL

## 2020-07-25 ENCOUNTER — Ambulatory Visit: Admission: RE | Admit: 2020-07-25 | Payer: 59 | Source: Ambulatory Visit

## 2020-08-12 ENCOUNTER — Ambulatory Visit
Admission: RE | Admit: 2020-08-12 | Discharge: 2020-08-12 | Disposition: A | Discharge status: Home / Self Care | Payer: 59 | Source: Ambulatory Visit | Attending: Family Medicine | Admitting: Family Medicine

## 2020-08-12 ENCOUNTER — Other Ambulatory Visit: Payer: Self-pay

## 2020-08-12 DIAGNOSIS — Z1231 Encounter for screening mammogram for malignant neoplasm of breast: Secondary | ICD-10-CM | POA: Diagnosis not present

## 2020-08-19 ENCOUNTER — Ambulatory Visit: Payer: 59 | Admitting: Family Medicine

## 2020-10-07 ENCOUNTER — Telehealth: Payer: Self-pay

## 2020-10-07 NOTE — Telephone Encounter (Signed)
Spoke w/patient. She needs STD test results. Advised that is in our old charting system. We will review and contact her when ready.

## 2020-10-07 NOTE — Telephone Encounter (Signed)
Patient requesting copy of results from 4-5 years ago with Dr. Tiburcio Pea. DB#[phone]

## 2020-10-08 NOTE — Telephone Encounter (Signed)
LMVM to notify lab printed and left at front desk for pick up. We are unable to send thru my chart. Patient needs to sign release form.

## 2020-12-04 NOTE — Progress Notes (Signed)
Name: Donna Wyatt   MRN: [id number]    DOB: April 06, 1971   Date:12/06/2020       Progress Note  Subjective  Chief Complaint  Annual Exam   HPI  Patient presents for annual CPE.  Seasonal Affective Disorder: she came in Fall of 2021 feeling stressed, we started her on Lexapro and Trazodone for sleep. She states took Lexapro for about 3 weeks and was able to wean self off. Changed jobs in December and is feeling much better now. She is working as a Hospital doctor. She still has problems staying asleep, she states she is able to stay asleep when she takes Trazodone, however she needs to take it earlier in the pm otherwise feels groggy the following day.  She has a long history of seasonal affective disorder, she states this past winter she did not do anything, she states now that the days are longer she is feeling more energized   Diet: balanced  Exercise: discussed importance physical activity   [place] [facility] Visit from 07/22/2020 in [facility]  AUDIT-C Score 0     Depression: Phq 9 is  negative Depression screen [facility] 2/9 12/06/2020 07/22/2020 07/22/2020 12/05/2019 11/30/2018  Decreased Interest 0 2 0 0 0  Down, Depressed, Hopeless 0 2 0 0 0  PHQ - 2 Score 0 4 0 0 0  Altered sleeping 2 2 - 3 3  Tired, decreased energy 2 2 - 3 1  Change in appetite 0 0 - 0 0  Feeling bad or failure about yourself  0 2 - 0 0  Trouble concentrating 0 2 - 0 0  Moving slowly or fidgety/restless 0 0 - 0 0  Suicidal thoughts 0 0 - 0 0  PHQ-9 Score 4 12 - 6 4  Difficult doing work/chores - Somewhat difficult - Somewhat difficult Somewhat difficult    GAD 7 : Generalized Anxiety Score 12/06/2020 07/22/2020 12/05/2019 11/30/2018  Nervous, Anxious, on Edge 0 1 0 0  Control/stop worrying 0 1 0 0  Worry too much - different things 0 0 0 0  Trouble relaxing 0 1 0 0  Restless 0 0 0 0  Easily annoyed or irritable 0 1 0 0  Afraid - awful might happen 0 1 0 0  Total GAD 7 Score 0 5 0 0   Anxiety Difficulty - Somewhat difficult Not difficult at all Not difficult at all    Hypertension: BP Readings from Last 3 Encounters:  12/06/20 128/82  07/22/20 122/78  12/05/19 (!) 136/94   Obesity: Wt Readings from Last 3 Encounters:  12/06/20 165 lb (74.8 kg)  07/22/20 158 lb 14.4 oz (72.1 kg)  12/05/19 159 lb 12.8 oz (72.5 kg)   BMI Readings from Last 3 Encounters:  12/06/20 26.63 kg/m  07/22/20 25.65 kg/m  12/05/19 25.99 kg/m     Vaccines:   Pneumonia: educated and discussed with patient. Flu: at work   [place]: 07/22/2020 STD testing and prevention (HIV/chl/gon/syphilis): N/A Intimate partner violence: negative screen  Sexual History : no pain.  Menstrual History/LMP/Abnormal Bleeding: s/p hysterectomy  Incontinence Symptoms: mild stress incontinence   Breast cancer:  - Last Mammogram: 08/12/2020 - BRCA gene screening: N/A  Osteoporosis: Discussed high calcium and vitamin D supplementation, weight bearing exercises  Cervical cancer screening: 11/30/2018  Skin cancer: Discussed monitoring for atypical lesions  Colorectal cancer: 07/21/2019  ECG: 12/29/2009  Advanced Care Planning: A voluntary discussion about advance care planning including the explanation and discussion of advance  directives.  Discussed health care proxy and Living will, and the patient was able to identify a health care proxy as husband   She has documents   Lipids: Lab Results  Component Value Date   CHOL 203 (H) 07/22/2020   CHOL 206 (H) 03/01/2018   Lab Results  Component Value Date   HDL 59 07/22/2020   HDL 59 03/01/2018   Lab Results  Component Value Date   LDLCALC 125 (H) 07/22/2020   LDLCALC 119 (H) 03/01/2018   Lab Results  Component Value Date   TRIG 90 07/22/2020   TRIG 165 (H) 03/01/2018   Lab Results  Component Value Date   CHOLHDL 3.4 07/22/2020   CHOLHDL 3.5 03/01/2018   No results found for: LDLDIRECT  Glucose: Glucose  Date Value Ref  Range Status  08/26/2013 96 65 - 99 mg/dL Final   Glucose, Bld  Date Value Ref Range Status  07/22/2020 86 65 - 99 mg/dL Final    Comment:    .            Fasting reference interval .   03/01/2018 88 65 - 139 mg/dL Final    Comment:    .        Non-fasting reference interval .   09/15/2017 105 (H) 65 - 99 mg/dL Final    Patient Active Problem List   Diagnosis Date Noted  . Seasonal affective disorder ([place]) 12/06/2020  . Diverticulosis 06/18/2015  . History of hysterectomy, supracervical 06/18/2015  . Breast lump in female 06/17/2015  . Chronic constipation 06/17/2015  . Cephalalgia 06/17/2015    Past Surgical History:  Procedure Laterality Date  . ABDOMINAL HYSTERECTOMY  2008   Partial  . COLONOSCOPY WITH PROPOFOL N/A 07/21/2019   Procedure: COLONOSCOPY WITH PROPOFOL;  Surgeon: Jonathon Bellows, MD;  Location: [facility] ENDOSCOPY;  Service: Gastroenterology;  Laterality: N/A;    Family History  Problem Relation Age of Onset  . Diabetes Mother   . Hyperlipidemia Father   . Hypertension Father   . Glaucoma Father   . Hyperlipidemia Sister   . Hypertension Sister   . Asthma Sister   . Fibroids Sister   . Breast cancer Neg Hx     Social History   Socioeconomic History  . Marital status: Married    Spouse name: Mitzi Hansen  . Number of children: 2  . Years of education: Not on file  . Highest education level: Associate degree: academic program  Occupational History  . Not on file  Tobacco Use  . Smoking status: Never Smoker  . Smokeless tobacco: Never Used  Vaping Use  . Vaping Use: Never used  Substance and Sexual Activity  . Alcohol use: Yes    Alcohol/week: 0.0 standard drinks    Comment: occasionally  . Drug use: No  . Sexual activity: Yes    Partners: Male    Birth control/protection: Surgical  Other Topics Concern  . Not on file  Social History Narrative  . Not on file   Social Determinants of Health   Financial Resource Strain: Low Risk   .  Difficulty of Paying Living Expenses: Not hard at all  Food Insecurity: No Food Insecurity  . Worried About Charity fundraiser in the Last Year: Never true  . Ran Out of Food in the Last Year: Never true  Transportation Needs: No Transportation Needs  . Lack of Transportation (Medical): No  . Lack of Transportation (Non-Medical): No  Physical Activity: Insufficiently Active  . Days  of Exercise per Week: 5 days  . Minutes of Exercise per Session: 10 min  Stress: Stress Concern Present  . Feeling of Stress : To some extent  Social Connections: Moderately Isolated  . Frequency of Communication with Friends and Family: More than three times a week  . Frequency of Social Gatherings with Friends and Family: Once a week  . Attends Religious Services: Never  . Active Member of Clubs or Organizations: No  . Attends Archivist Meetings: Never  . Marital Status: Married  Human resources officer Violence: Not At Risk  . Fear of Current or Ex-Partner: No  . Emotionally Abused: No  . Physically Abused: No  . Sexually Abused: No     Current Outpatient Medications:  .  traZODone (DESYREL) 50 MG tablet, Take 1 tablet (50 mg total) by mouth at bedtime as needed for sleep., Disp: 30 tablet, Rfl: 0  No Known Allergies   ROS  Constitutional: Negative for fever or weight change.  Respiratory: Negative for cough and shortness of breath.   Cardiovascular: Negative for chest pain or palpitations.  Gastrointestinal: Negative for abdominal pain, no bowel changes.  Musculoskeletal: Negative for gait problem or joint swelling.  Skin: Negative for rash.  Neurological: Negative for dizziness or headache.  No other specific complaints in a complete review of systems (except as listed in HPI above).  Objective  Vitals:   12/06/20 1509  BP: 128/82  Pulse: 87  Resp: 16  Temp: 98.5 F (36.9 C)  TempSrc: Oral  SpO2: 99%  Weight: 165 lb (74.8 kg)  Height: 5' 6" (1.676 m)    Body mass index  is 26.63 kg/m.  Physical Exam  Constitutional: Patient appears well-developed and well-nourished. No distress.  HENT: Head: Normocephalic and atraumatic. Ears: B TMs ok, no erythema or effusion; Nose: Not done . Mouth/Throat: not done  Eyes: Conjunctivae and EOM are normal. Pupils are equal, round, and reactive to light. No scleral icterus.  Neck: Normal range of motion. Neck supple. No JVD present. No thyromegaly present.  Cardiovascular: Normal rate, regular rhythm and normal heart sounds.  No murmur heard. No BLE edema. Pulmonary/Chest: Effort normal and breath sounds normal. No respiratory distress. Abdominal: Soft. Bowel sounds are normal, no distension. There is no tenderness. no masses Breast: no lumps or masses, no nipple discharge or rashes FEMALE GENITALIA:  Not done, no cervix on last pap done 2020  RECTAL: not done  Musculoskeletal: Normal range of motion, no joint effusions. No gross deformities Neurological: he is alert and oriented to person, place, and time. No cranial nerve deficit. Coordination, balance, strength, speech and gait are normal.  Skin: Skin is warm and dry. No rash noted. No erythema.  Psychiatric: Patient has a normal mood and affect. behavior is normal. Judgment and thought content normal.  Fall Risk: Fall Risk  12/06/2020 07/22/2020 12/05/2019 11/30/2018 03/01/2018  Falls in the past year? 0 0 0 0 No  Number falls in past yr: 0 0 0 - -  Injury with Fall? 0 0 0 - -  Follow up - Falls evaluation completed - - -     Functional Status Survey: Is the patient deaf or have difficulty hearing?: No Does the patient have difficulty seeing, even when wearing glasses/contacts?: No Does the patient have difficulty concentrating, remembering, or making decisions?: No Does the patient have difficulty walking or climbing stairs?: No Does the patient have difficulty dressing or bathing?: No Does the patient have difficulty doing errands alone such as  visiting a doctor's  office or shopping?: No   Assessment & Plan  1. Persistent insomnia  - traZODone (DESYREL) 50 MG tablet; Take 1 tablet (50 mg total) by mouth at bedtime as needed for sleep.  Dispense: 90 tablet; Refill: 1  1. Persistent insomnia  - traZODone (DESYREL) 50 MG tablet; Take 1 tablet (50 mg total) by mouth at bedtime as needed for sleep.  Dispense: 90 tablet; Refill: 1  2. Seasonal affective disorder [facility])  She will return in the Fall to resume lexapro   3. Well adult exam   -USPSTF grade A and B recommendations reviewed with patient; age-appropriate recommendations, preventive care, screening tests, etc discussed and encouraged; healthy living encouraged; see AVS for patient education given to patient -Discussed importance of 150 minutes of physical activity weekly, eat two servings of fish weekly, eat one serving of tree nuts ( cashews, pistachios, pecans, almonds.Marland Kitchen) every other day, eat 6 servings of fruit/vegetables daily and drink plenty of water and avoid sweet beverages.

## 2020-12-04 NOTE — Patient Instructions (Signed)
Preventive Care 50-50 Years Old, Female Preventive care refers to lifestyle choices and visits with your health care provider that can promote health and wellness. This includes:  A yearly physical exam. This is also called an annual wellness visit.  Regular dental and eye exams.  Immunizations.  Screening for certain conditions.  Healthy lifestyle choices, such as: ? Eating a healthy diet. ? Getting regular exercise. ? Not using drugs or products that contain nicotine and tobacco. ? Limiting alcohol use. What can I expect for my preventive care visit? Physical exam Your health care provider will check your:  Height and weight. These may be used to calculate your BMI (body mass index). BMI is a measurement that tells if you are at a healthy weight.  Heart rate and blood pressure.  Body temperature.  Skin for abnormal spots. Counseling Your health care provider may ask you questions about your:  Past medical problems.  Family's medical history.  Alcohol, tobacco, and drug use.  Emotional well-being.  Home life and relationship well-being.  Sexual activity.  Diet, exercise, and sleep habits.  Work and work Statistician.  Access to firearms.  Method of birth control.  Menstrual cycle.  Pregnancy history. What immunizations do I need? Vaccines are usually given at various ages, according to a schedule. Your health care provider will recommend vaccines for you based on your age, medical history, and lifestyle or other factors, such as travel or where you work.   What tests do I need? Blood tests  Lipid and cholesterol levels. These may be checked every 5 years, or more often if you are over 50 years old.  Hepatitis C test.  Hepatitis B test. Screening  Lung cancer screening. You may have this screening every year starting at age 50 if you have a 30-pack-year history of smoking and currently smoke or have quit within the past 15 years.  Colorectal cancer  screening. ? All adults should have this screening starting at age 50 and continuing until age 17. ? Your health care provider may recommend screening at age 50 if you are at increased risk. ? You will have tests every 1-10 years, depending on your results and the type of screening test.  Diabetes screening. ? This is done by checking your blood sugar (glucose) after you have not eaten for a while (fasting). ? You may have this done every 1-3 years.  Mammogram. ? This may be done every 1-2 years. ? Talk with your health care provider about when you should start having regular mammograms. This may depend on whether you have a family history of breast cancer.  BRCA-related cancer screening. This may be done if you have a family history of breast, ovarian, tubal, or peritoneal cancers.  Pelvic exam and Pap test. ? This may be done every 3 years starting at age 10. ? Starting at age 11, this may be done every 5 years if you have a Pap test in combination with an HPV test. Other tests  STD (sexually transmitted disease) testing, if you are at risk.  Bone density scan. This is done to screen for osteoporosis. You may have this scan if you are at high risk for osteoporosis. Talk with your health care provider about your test results, treatment options, and if necessary, the need for more tests. Follow these instructions at home: Eating and drinking  Eat a diet that includes fresh fruits and vegetables, whole grains, lean protein, and low-fat dairy products.  Take vitamin and mineral supplements  as recommended by your health care provider.  Do not drink alcohol if: ? Your health care provider tells you not to drink. ? You are pregnant, may be pregnant, or are planning to become pregnant.  If you drink alcohol: ? Limit how much you have to 0-1 drink a day. ? Be aware of how much alcohol is in your drink. In the [place] one drink equals one 12 oz bottle of beer (355 mL), one 5 oz glass of  wine (148 mL), or one 1 oz glass of hard liquor (44 mL).   Lifestyle  Take daily care of your teeth and gums. Brush your teeth every morning and night with fluoride toothpaste. Floss one time each day.  Stay active. Exercise for at least 30 minutes 5 or more days each week.  Do not use any products that contain nicotine or tobacco, such as cigarettes, e-cigarettes, and chewing tobacco. If you need help quitting, ask your health care provider.  Do not use drugs.  If you are sexually active, practice safe sex. Use a condom or other form of protection to prevent STIs (sexually transmitted infections).  If you do not wish to become pregnant, use a form of birth control. If you plan to become pregnant, see your health care provider for a prepregnancy visit.  If told by your health care provider, take low-dose aspirin daily starting at age 50.  Find healthy ways to cope with stress, such as: ? Meditation, yoga, or listening to music. ? Journaling. ? Talking to a trusted person. ? Spending time with friends and family. Safety  Always wear your seat belt while driving or riding in a vehicle.  Do not drive: ? If you have been drinking alcohol. Do not ride with someone who has been drinking. ? When you are tired or distracted. ? While texting.  Wear a helmet and other protective equipment during sports activities.  If you have firearms in your house, make sure you follow all gun safety procedures. What's next?  Visit your health care provider once a year for an annual wellness visit.  Ask your health care provider how often you should have your eyes and teeth checked.  Stay up to date on all vaccines. This information is not intended to replace advice given to you by your health care provider. Make sure you discuss any questions you have with your health care provider. Document Revised: 06/18/2020 Document Reviewed: 05/26/2018 Elsevier Patient Education  2021 Elsevier Inc.  

## 2020-12-06 ENCOUNTER — Other Ambulatory Visit: Payer: Self-pay | Admitting: Family Medicine

## 2020-12-06 ENCOUNTER — Other Ambulatory Visit: Payer: Self-pay

## 2020-12-06 ENCOUNTER — Ambulatory Visit (INDEPENDENT_AMBULATORY_CARE_PROVIDER_SITE_OTHER): Payer: 59 | Admitting: Family Medicine

## 2020-12-06 ENCOUNTER — Encounter: Payer: Self-pay | Admitting: Family Medicine

## 2020-12-06 VITALS — BP 128/82 | HR 87 | Temp 98.5°F | Resp 16 | Ht 66.0 in | Wt 165.0 lb

## 2020-12-06 DIAGNOSIS — G4700 Insomnia, unspecified: Secondary | ICD-10-CM | POA: Diagnosis not present

## 2020-12-06 DIAGNOSIS — F338 Other recurrent depressive disorders: Secondary | ICD-10-CM

## 2020-12-06 DIAGNOSIS — Z0000 Encounter for general adult medical examination without abnormal findings: Secondary | ICD-10-CM

## 2020-12-06 MED ORDER — TRAZODONE HCL 50 MG PO TABS: 50.0000 mg | ORAL_TABLET | Freq: Every evening | ORAL | 1 refills | Status: DC | PRN

## 2021-01-24 IMAGING — MG DIGITAL SCREENING BILAT W/ TOMO W/ CAD
8 series · 8 of 24 positions shown · non-contrast
Comparison: Previous exam(s).

CLINICAL DATA: Screening.

EXAM:
DIGITAL SCREENING BILATERAL MAMMOGRAM WITH TOMO AND CAD

[R CC synth-2D]
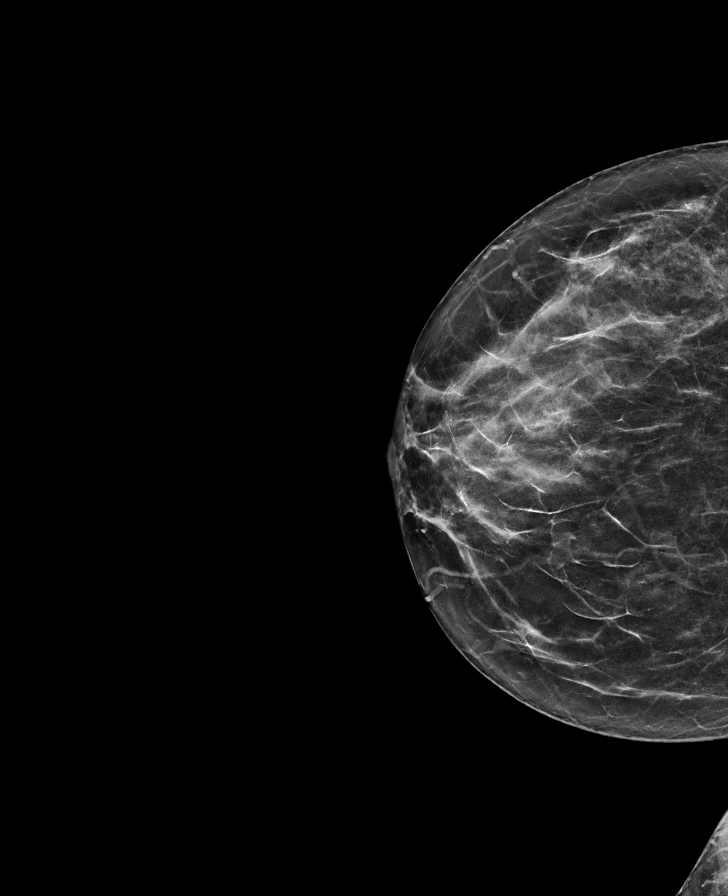

[R MLO synth-2D]
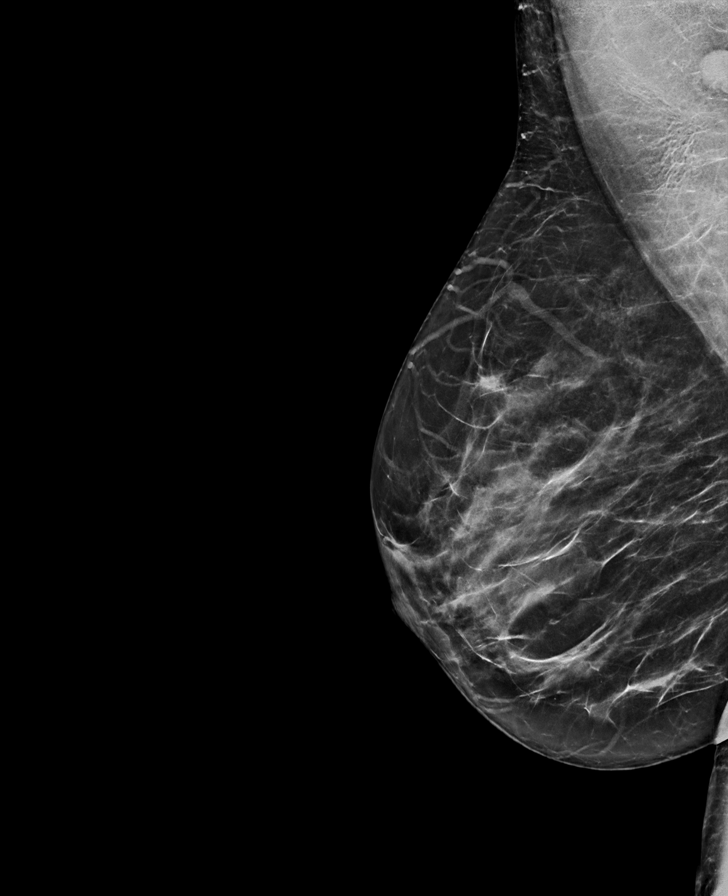

[L CC synth-2D]
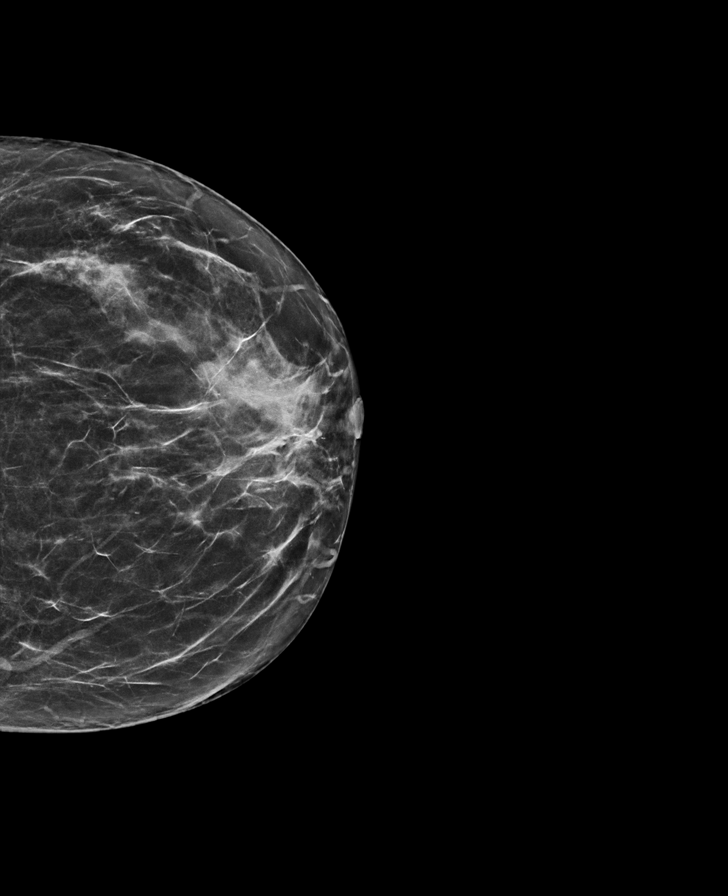

[L MLO synth-2D]
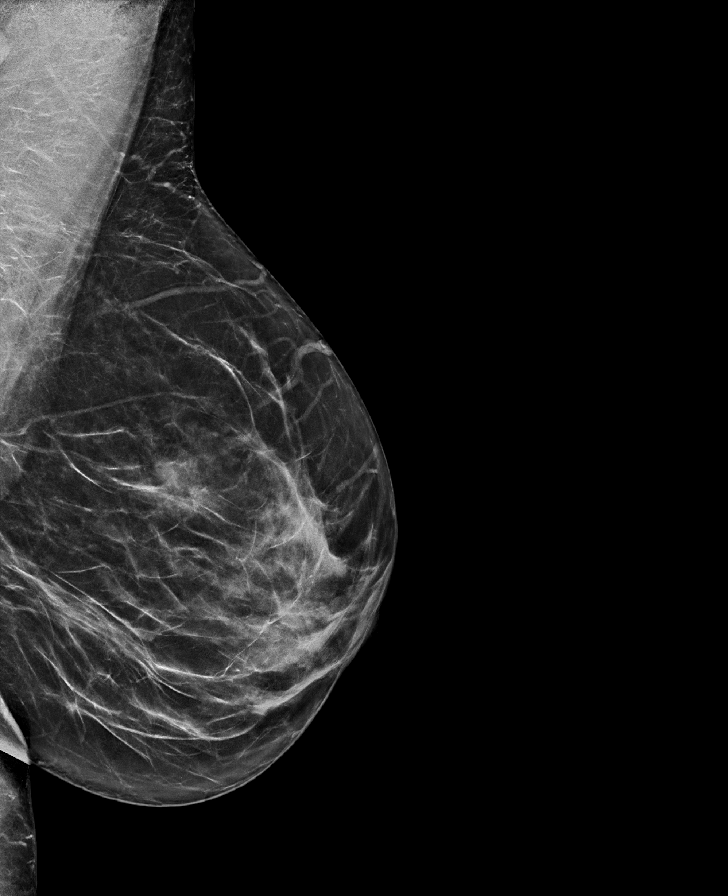

[R CC tomo · tomo slice 31/62.0]
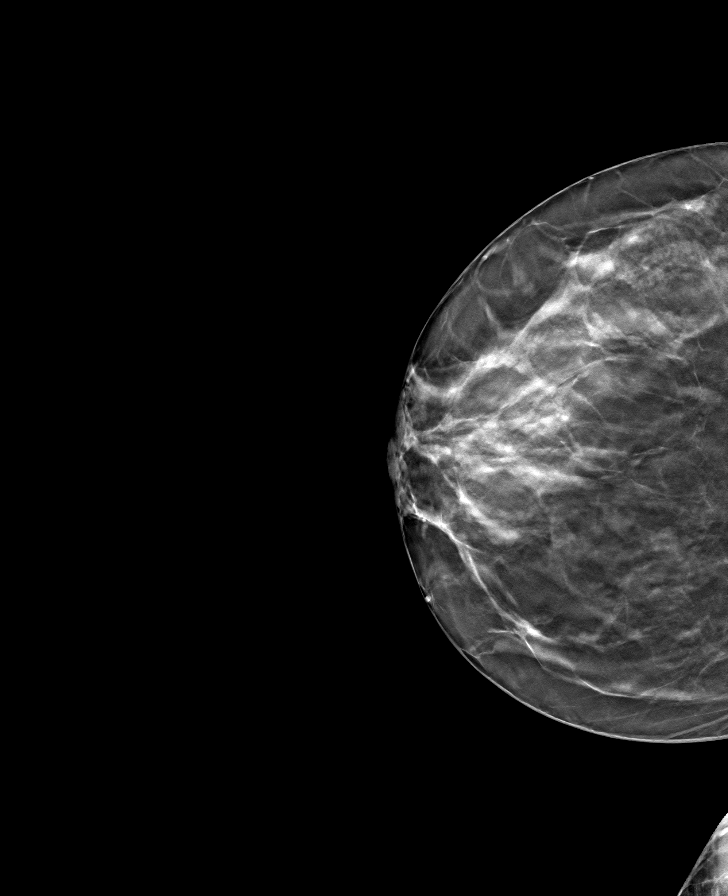

[L MLO tomo · tomo slice 35/69.0]
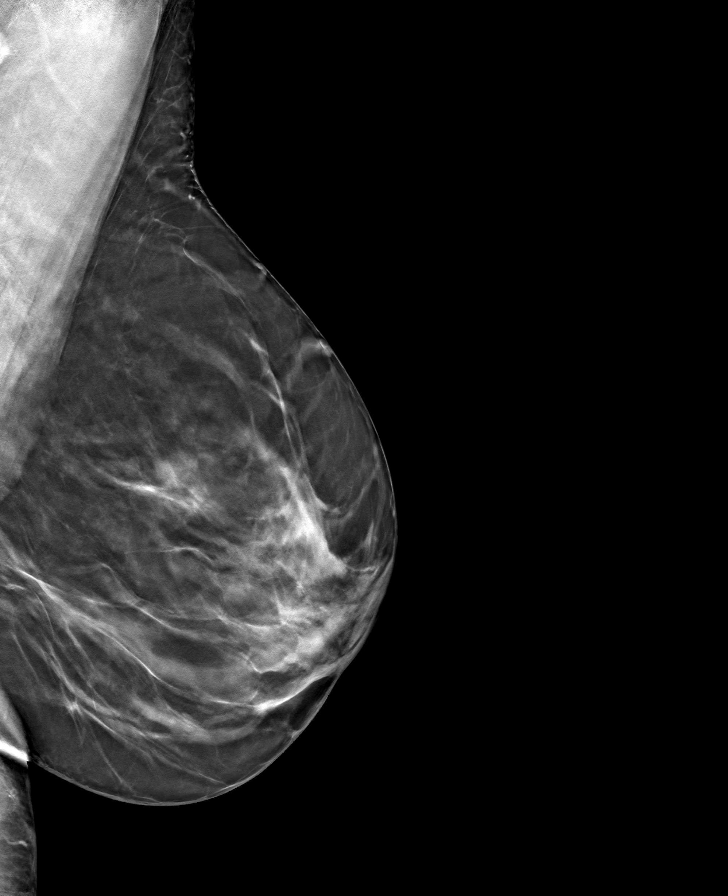

[R MLO tomo · tomo slice 35/69.0]
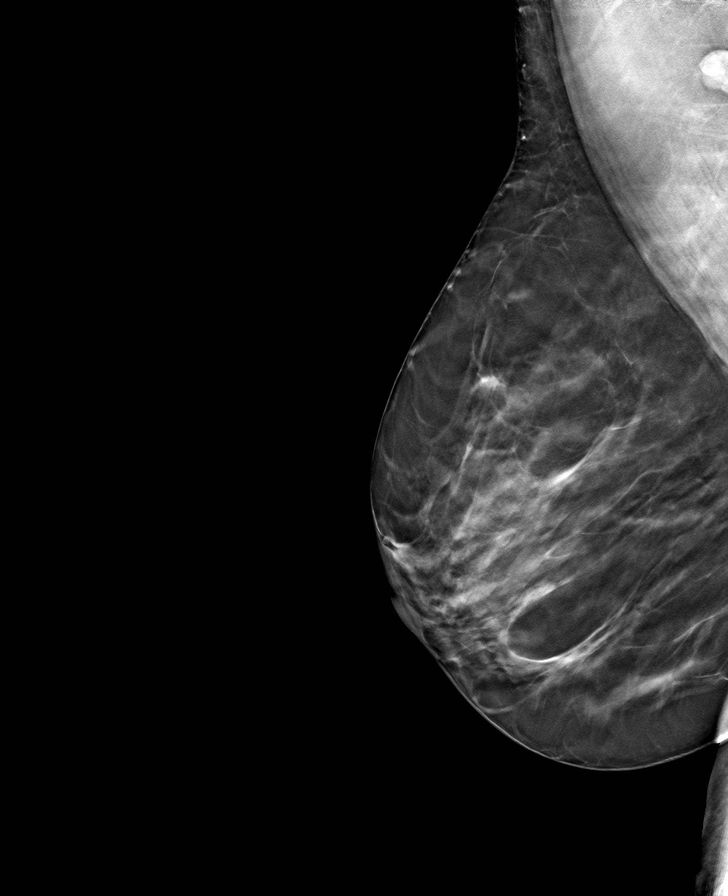

[L CC tomo · tomo slice 33/64.0]
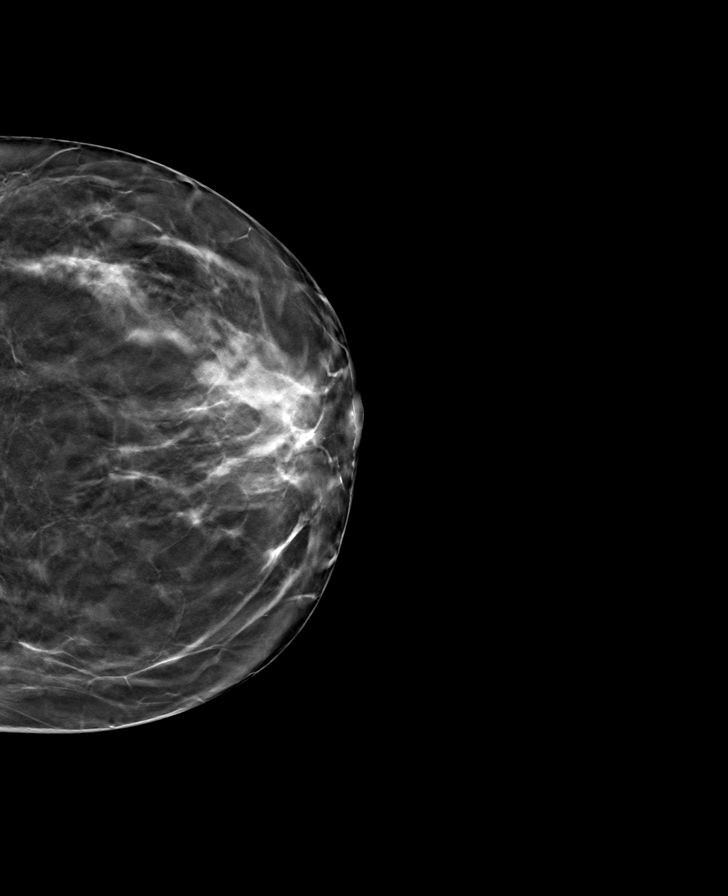

[8 of 24 positions shown; findings below may reference images not displayed]

ACR Breast Density Category c: The breast tissue is heterogeneously
dense, which may obscure small masses.
FINDINGS: There are no findings suspicious for malignancy. Images were
processed with CAD.
IMPRESSION: No mammographic evidence of malignancy. A result letter of this
screening mammogram will be mailed directly to the patient.

RECOMMENDATION:
Screening mammogram in one year. (Code:[id number])

BI-RADS CATEGORY  1: Negative.

## 2021-06-30 ENCOUNTER — Other Ambulatory Visit: Payer: Self-pay

## 2021-06-30 DIAGNOSIS — Z1231 Encounter for screening mammogram for malignant neoplasm of breast: Secondary | ICD-10-CM

## 2021-12-26 ENCOUNTER — Other Ambulatory Visit: Payer: Self-pay

## 2021-12-26 DIAGNOSIS — M25561 Pain in right knee: Secondary | ICD-10-CM | POA: Diagnosis not present

## 2021-12-26 MED ORDER — MELOXICAM 15 MG PO TABS
ORAL_TABLET | ORAL | 0 refills | Status: DC
  Filled 2021-12-26: qty 30, 30d supply, fill #0

## 2021-12-26 MED ORDER — METHYLPREDNISOLONE 4 MG PO TBPK
ORAL_TABLET | ORAL | 0 refills | Status: DC
  Filled 2021-12-26: qty 21, 6d supply, fill #0

## 2021-12-30 ENCOUNTER — Other Ambulatory Visit: Payer: Self-pay

## 2021-12-30 DIAGNOSIS — M25561 Pain in right knee: Secondary | ICD-10-CM | POA: Diagnosis not present

## 2021-12-30 MED ORDER — CELECOXIB 200 MG PO CAPS
ORAL_CAPSULE | ORAL | 0 refills | Status: DC
  Filled 2021-12-30: qty 30, 30d supply, fill #0

## 2022-01-07 DIAGNOSIS — M25561 Pain in right knee: Secondary | ICD-10-CM | POA: Diagnosis not present

## 2022-01-12 DIAGNOSIS — S8291XA Unspecified fracture of right lower leg, initial encounter for closed fracture: Secondary | ICD-10-CM | POA: Diagnosis not present

## 2022-02-27 ENCOUNTER — Other Ambulatory Visit: Payer: Self-pay

## 2022-02-27 DIAGNOSIS — S8291XD Unspecified fracture of right lower leg, subsequent encounter for closed fracture with routine healing: Secondary | ICD-10-CM | POA: Diagnosis not present

## 2022-02-27 MED ORDER — CELECOXIB 200 MG PO CAPS
ORAL_CAPSULE | ORAL | 0 refills | Status: DC
  Filled 2022-02-27: qty 60, 30d supply, fill #0

## 2022-03-23 ENCOUNTER — Encounter: Payer: 59 | Admitting: Family Medicine

## 2022-04-18 ENCOUNTER — Encounter: Payer: Self-pay | Admitting: Emergency Medicine

## 2022-04-18 ENCOUNTER — Ambulatory Visit
Admission: EM | Admit: 2022-04-18 | Discharge: 2022-04-18 | Disposition: A | Discharge status: Home / Self Care | Payer: 59 | Attending: Family Medicine | Admitting: Family Medicine

## 2022-04-18 ENCOUNTER — Other Ambulatory Visit: Payer: Self-pay

## 2022-04-18 DIAGNOSIS — J019 Acute sinusitis, unspecified: Secondary | ICD-10-CM

## 2022-04-18 MED ORDER — AMOXICILLIN 875 MG PO TABS: 875.0000 mg | ORAL_TABLET | Freq: Two times a day (BID) | ORAL | 0 refills | Status: AC

## 2022-04-18 NOTE — ED Provider Notes (Signed)
Renaldo Fiddler    CSN: [id number] Arrival date & time: 04/18/22  0803      History   Chief Complaint Chief Complaint  Patient presents with   Sore Throat   Ear Fullness    HPI Donna Wyatt is a 51 y.o. female.   HPI Patient presents today with sore throat, left ear fullness, and nasal congestion, with all symptoms present for over 2 weeks. She denies fever. She has taken multiple OTC medications to manage symptoms. Endorses pain with swallowing. Denies worrisome symptoms of SOB,wheezing, or chest tightness. Past Medical History:  Diagnosis Date   Chronic constipation    Diverticulitis    History of uterine fibroid     Patient Active Problem List   Diagnosis Date Noted   Seasonal affective disorder ([facility]) 12/06/2020   Diverticulosis 06/18/2015   History of hysterectomy, supracervical 06/18/2015   Breast lump in female 06/17/2015   Chronic constipation 06/17/2015   Cephalalgia 06/17/2015    Past Surgical History:  Procedure Laterality Date   ABDOMINAL HYSTERECTOMY  2008   Partial   COLONOSCOPY WITH PROPOFOL N/A 07/21/2019   Procedure: COLONOSCOPY WITH PROPOFOL;  Surgeon: Wyline Mood, MD;  [facility] ENDOSCOPY;  Service: Gastroenterology;  Laterality: N/A;    OB History     Gravida  2   Para      Term      Preterm      AB      Living  2      SAB      IAB      Ectopic      Multiple      Live Births           Obstetric Comments  1st Menstrual Cycle:  15 1st Pregnancy:  27            Home Medications    Prior to Admission medications   Medication Sig Start Date End Date Taking? Authorizing Provider  celecoxib (CELEBREX) 200 MG capsule Take 1 capsule twice a day by oral route as needed. 02/27/22     meloxicam (MOBIC) 15 MG tablet Take 1 tablet every day by oral route with meals for 30 days. 12/26/21     methylPREDNISolone (MEDROL) 4 MG TBPK tablet Take 1 dose pk by oral route. 12/26/21     traZODone (DESYREL) 50 MG  tablet TAKE 1 TABLET BY MOUTH AT BEDTIME AS NEEDED FOR SLEEP. 12/06/20 12/06/21  Alba Cory, MD  escitalopram (LEXAPRO) 10 MG tablet Take 1 tablet (10 mg total) by mouth daily. Patient not taking: Reported on 12/06/2020 07/22/20 12/06/20  Alba Cory, MD    Family History Family History  Problem Relation Age of Onset   Diabetes Mother    Hyperlipidemia Father    Hypertension Father    Glaucoma Father    Hyperlipidemia Sister    Hypertension Sister    Asthma Sister    Fibroids Sister    Breast cancer Neg Hx     Social History Social History   Tobacco Use   Smoking status: Never   Smokeless tobacco: Never  Vaping Use   Vaping Use: Never used  Substance Use Topics   Alcohol use: Yes    Alcohol/week: 0.0 standard drinks of alcohol    Comment: occasionally   Drug use: No     Allergies   Patient has no known allergies.   Review of Systems Review of Systems Pertinent negatives listed in HPI   Physical Exam Triage Vital Signs  ED Triage Vitals  Enc Vitals Group     BP 04/18/22 0809 (!) 147/97     Pulse Rate 04/18/22 0809 85     Resp 04/18/22 0809 16     Temp 04/18/22 0809 98.7 F (37.1 C)     Temp Source 04/18/22 0809 Oral     SpO2 04/18/22 0809 96 %     Weight --      Height --      Head Circumference --      Peak Flow --      Pain Score 04/18/22 0811 4     Pain Loc --      Pain Edu? --      Excl. in GC? --    No data found.  Updated Vital Signs BP (!) 147/97 (BP Location: Left Arm)   Pulse 85   Temp 98.7 F (37.1 C) (Oral)   Resp 16   SpO2 96%   Visual Acuity Right Eye Distance:   Left Eye Distance:   Bilateral Distance:    Right Eye Near:   Left Eye Near:    Bilateral Near:     Physical Exam  General Appearance:    Alert, cooperative, no distress  HENT:   Normocephalic, ears normal, nares mucosal edema with congestion, rhinorrhea, oropharynx mild erythema without exudate   Eyes:    PERRL, conjunctiva/corneas clear, EOM's intact        Lungs:     Clear to auscultation bilaterally, respirations unlabored  Heart:    Regular Rate and Rhythm  Neurologic:   Awake, alert, oriented x 3. No apparent focal neurological           defect.      UC Treatments / Results  Labs (all labs ordered are listed, but only abnormal results are displayed) Labs Reviewed  POCT RAPID STREP A (OFFICE)    EKG   Radiology No results found.  Procedures Procedures (including critical care time)  Medications Ordered in UC Medications - No data to display  Initial Impression / Assessment and Plan / UC Course  I have reviewed the triage vital signs and the nursing notes.  Pertinent labs & imaging results that were available during my care of the patient were reviewed by me and considered in my medical decision making (see chart for details).    Acute Non-Recurrent Sinusitis  Treatment with Amoxicillin BID x 7 days Continue OTC management with Sudafed Hydrate well with fluids. Return here or follow-up with PCP as needed.  Final Clinical Impressions(s) / UC Diagnoses   Final diagnoses:  Acute non-recurrent sinusitis, unspecified location   Discharge Instructions   None    ED Prescriptions     Medication Sig Dispense Auth. Provider   amoxicillin (AMOXIL) 875 MG tablet Take 1 tablet (875 mg total) by mouth 2 (two) times daily for 7 days. 14 tablet Bing Neighbors, FNP      PDMP not reviewed this encounter.   [place]ia, [place], [place] 04/18/22 [phone]

## 2022-04-18 NOTE — ED Triage Notes (Addendum)
Patient c/o sore throat and LFT ear fullness x 2 weeks.   Patient denies muffled ear sounds. Patient denies fever.   Patient endorses onset of symptoms began with nasal congestion.   Patient endorses some painful swallowing.   Patient has used OTC Tylenol and nasal decongestants with some relief of symptoms.

## 2022-06-23 NOTE — Patient Instructions (Signed)
Preventive Care 51-51 Years Old, Female Preventive care refers to lifestyle choices and visits with your health care provider that can promote health and wellness. Preventive care visits are also called wellness exams. What can I expect for my preventive care visit? Counseling Your health care provider may ask you questions about your: Medical history, including: Past medical problems. Family medical history. Pregnancy history. Current health, including: Menstrual cycle. Method of birth control. Emotional well-being. Home life and relationship well-being. Sexual activity and sexual health. Lifestyle, including: Alcohol, nicotine or tobacco, and drug use. Access to firearms. Diet, exercise, and sleep habits. Work and work Statistician. Sunscreen use. Safety issues such as seatbelt and bike helmet use. Physical exam Your health care provider will check your: Height and weight. These may be used to calculate your BMI (body mass index). BMI is a measurement that tells if you are at a healthy weight. Waist circumference. This measures the distance around your waistline. This measurement also tells if you are at a healthy weight and may help predict your risk of certain diseases, such as type 2 diabetes and high blood pressure. Heart rate and blood pressure. Body temperature. Skin for abnormal spots. What immunizations do I need?  Vaccines are usually given at various ages, according to a schedule. Your health care provider will recommend vaccines for you based on your age, medical history, and lifestyle or other factors, such as travel or where you work. What tests do I need? Screening Your health care provider may recommend screening tests for certain conditions. This may include: Lipid and cholesterol levels. Diabetes screening. This is done by checking your blood sugar (glucose) after you have not eaten for a while (fasting). Pelvic exam and Pap test. Hepatitis B test. Hepatitis C  test. HIV (human immunodeficiency virus) test. STI (sexually transmitted infection) testing, if you are at risk. Lung cancer screening. Colorectal cancer screening. Mammogram. Talk with your health care provider about when you should start having regular mammograms. This may depend on whether you have a family history of breast cancer. BRCA-related cancer screening. This may be done if you have a family history of breast, ovarian, tubal, or peritoneal cancers. Bone density scan. This is done to screen for osteoporosis. Talk with your health care provider about your test results, treatment options, and if necessary, the need for more tests. Follow these instructions at home: Eating and drinking  Eat a diet that includes fresh fruits and vegetables, whole grains, lean protein, and low-fat dairy products. Take vitamin and mineral supplements as recommended by your health care provider. Do not drink alcohol if: Your health care provider tells you not to drink. You are pregnant, may be pregnant, or are planning to become pregnant. If you drink alcohol: Limit how much you have to 0-1 drink a day. Know how much alcohol is in your drink. In the [place] one drink equals one 12 oz bottle of beer (355 mL), one 5 oz glass of wine (148 mL), or one 1 oz glass of hard liquor (44 mL). Lifestyle Brush your teeth every morning and night with fluoride toothpaste. Floss one time each day. Exercise for at least 30 minutes 5 or more days each week. Do not use any products that contain nicotine or tobacco. These products include cigarettes, chewing tobacco, and vaping devices, such as e-cigarettes. If you need help quitting, ask your health care provider. Do not use drugs. If you are sexually active, practice safe sex. Use a condom or other form of protection to  prevent STIs. If you do not wish to become pregnant, use a form of birth control. If you plan to become pregnant, see your health care provider for a  prepregnancy visit. Take aspirin only as told by your health care provider. Make sure that you understand how much to take and what form to take. Work with your health care provider to find out whether it is safe and beneficial for you to take aspirin daily. Find healthy ways to manage stress, such as: Meditation, yoga, or listening to music. Journaling. Talking to a trusted person. Spending time with friends and family. Minimize exposure to UV radiation to reduce your risk of skin cancer. Safety Always wear your seat belt while driving or riding in a vehicle. Do not drive: If you have been drinking alcohol. Do not ride with someone who has been drinking. When you are tired or distracted. While texting. If you have been using any mind-altering substances or drugs. Wear a helmet and other protective equipment during sports activities. If you have firearms in your house, make sure you follow all gun safety procedures. Seek help if you have been physically or sexually abused. What's next? Visit your health care provider once a year for an annual wellness visit. Ask your health care provider how often you should have your eyes and teeth checked. Stay up to date on all vaccines. This information is not intended to replace advice given to you by your health care provider. Make sure you discuss any questions you have with your health care provider. Document Revised: 03/12/2021 Document Reviewed: 03/12/2021 Elsevier Patient Education  [place].

## 2022-06-23 NOTE — Progress Notes (Unsigned)
Name: Donna Wyatt   MRN: [id number]    DOB: [birth year]   Date:06/24/2022       Progress Note  Subjective  Chief Complaint  Annual Exam  HPI  Patient presents for annual CPE, hives  Hives: she ate chinese food for lunch and within 15 minutes noticed some itching followed by whelps, no SOB, wheezing or difficulty breathing . She denies any history of hives, no change in hygiene products .   Elevated bp: it was elevated when she saw doctor for stress fracture of right knee Summer 2023, and again today , however she came in covered in hives, we will recheck bp on her next visit before initiating therapy   Diet: she has not been eating healthy lately Exercise: walking at lunch but not enough, needs to increase to 30 minutes daily   Last Eye Exam: up to date  Last Dental Exam: every 6 months   [place] [facility]  AUDIT-C Score 0      Depression: Phq 9 is  negative    06/24/2022    1:39 PM 12/06/2020    3:02 PM 07/22/2020    7:52 AM 07/22/2020    7:48 AM 12/05/2019    3:31 PM  Depression screen PHQ 2/9  Decreased Interest 0 0 2 0 0  Down, Depressed, Hopeless 0 0 2 0 0  PHQ - 2 Score 0 0 4 0 0  Altered sleeping 0 2 2  3   Tired, decreased energy 0 2 2  3   Change in appetite 0 0 0  0  Feeling bad or failure about yourself  0 0 2  0  Trouble concentrating 0 0 2  0  Moving slowly or fidgety/restless 0 0 0  0  Suicidal thoughts 0 0 0  0  PHQ-9 Score 0 4 12  6   Difficult doing work/chores Not difficult at all  Somewhat difficult  Somewhat difficult   Hypertension: BP Readings from Last 3 Encounters:  06/24/22 (!) 142/96  04/18/22 (!) 147/97  12/06/20 128/82   Obesity: Wt Readings from Last 3 Encounters:  06/24/22 170 lb 12.8 oz (77.5 kg)  12/06/20 165 lb (74.8 kg)  07/22/20 158 lb 14.4 oz (72.1 kg)   BMI Readings from Last 3 Encounters:  06/24/22 27.57 kg/m  12/06/20 26.63 kg/m  07/22/20 25.65 kg/m      Vaccines:   HPV: N/A Tdap: due this year  Shingrix: she will return for that due to hives  Pneumonia: N/A Flu: due, gets at [facility] COVID-19: up to date   Hep C Screening: 07/22/20 STD testing and prevention (HIV/chl/gon/syphilis): N/A Intimate partner violence: negative screen  Sexual History : one partner, no pain  Menstrual History/LMP/Abnormal Bleeding: s/p hysterectomy  Discussed importance of follow up if any post-menopausal bleeding: not applicable  Incontinence Symptoms: negative for symptoms   Breast cancer:  - Last Mammogram: Due now  - BRCA gene screening: N/A  Osteoporosis Prevention : Discussed high calcium and vitamin D supplementation, weight bearing exercises Bone density: history of stress fracture and we will check bone density   Cervical cancer screening: due, ordered today  Skin cancer: Discussed monitoring for atypical lesions  Colorectal cancer: 07/21/19   Lung cancer:  Low Dose CT Chest recommended if Age 51-80 years, 20 pack-year currently smoking OR have quit w/in 15years. Patient does not qualify for screen   ECG: 12/29/09  Advanced Care Planning: A voluntary discussion about advance care planning including  the explanation and discussion of advance directives.  Discussed health care proxy and Living will, and the patient was able to identify a health care proxy as husband .  Patient does not have a living will and power of attorney of health care   Lipids: Lab Results  Component Value Date   CHOL 203 (H) 07/22/2020   CHOL 206 (H) 03/01/2018   Lab Results  Component Value Date   HDL 59 07/22/2020   HDL 59 03/01/2018   Lab Results  Component Value Date   LDLCALC 125 (H) 07/22/2020   LDLCALC 119 (H) 03/01/2018   Lab Results  Component Value Date   TRIG 90 07/22/2020   TRIG 165 (H) 03/01/2018   Lab Results  Component Value Date   CHOLHDL 3.4 07/22/2020   CHOLHDL 3.5 03/01/2018   No results found for: "LDLDIRECT"  Glucose: Glucose   Date Value Ref Range Status  08/26/2013 96 65 - 99 mg/dL Final   Glucose, Bld  Date Value Ref Range Status  07/22/2020 86 65 - 99 mg/dL Final    Comment:    .            Fasting reference interval .   03/01/2018 88 65 - 139 mg/dL Final    Comment:    .        Non-fasting reference interval .   09/15/2017 105 (H) 65 - 99 mg/dL Final    Patient Active Problem List   Diagnosis Date Noted   Seasonal affective disorder ([place]) 12/06/2020   Diverticulosis 06/18/2015   History of hysterectomy, supracervical 06/18/2015   Breast lump in female 06/17/2015   Chronic constipation 06/17/2015   Cephalalgia 06/17/2015    Past Surgical History:  Procedure Laterality Date   ABDOMINAL HYSTERECTOMY  2008   Partial   COLONOSCOPY WITH PROPOFOL N/A 07/21/2019   Procedure: COLONOSCOPY WITH PROPOFOL;  Surgeon: Jonathon Bellows, MD;  Location: [facility] ENDOSCOPY;  Service: Gastroenterology;  Laterality: N/A;    Family History  Problem Relation Age of Onset   Diabetes Mother    Hyperlipidemia Father    Hypertension Father    Glaucoma Father    Hyperlipidemia Sister    Hypertension Sister    Asthma Sister    Fibroids Sister    Breast cancer Neg Hx     Social History   Socioeconomic History   Marital status: Married    Spouse name: Mitzi Hansen   Number of children: 2   Years of education: Not on file   Highest education level: Associate degree: academic program  Occupational History   Not on file  Tobacco Use   Smoking status: Never   Smokeless tobacco: Never  Vaping Use   Vaping Use: Never used  Substance and Sexual Activity   Alcohol use: Yes    Alcohol/week: 0.0 standard drinks of alcohol    Comment: occasionally   Drug use: No   Sexual activity: Yes    Partners: Male    Birth control/protection: Surgical  Other Topics Concern   Not on file  Social History Narrative   Not on file   Social Determinants of Health   Financial Resource Strain: Low Risk  (12/06/2020)   Overall  Financial Resource Strain (CARDIA)    Difficulty of Paying Living Expenses: Not hard at all  Food Insecurity: No Food Insecurity (12/06/2020)   Hunger Vital Sign    Worried About Running Out of Food in the Last Year: Never true    Ran Out of Food  in the Last Year: Never true  Transportation Needs: No Transportation Needs (12/06/2020)   PRAPARE - Hydrologist (Medical): No    Lack of Transportation (Non-Medical): No  Physical Activity: Insufficiently Active (12/06/2020)   Exercise Vital Sign    Days of Exercise per Week: 5 days    Minutes of Exercise per Session: 10 min  Stress: Stress Concern Present (12/06/2020)   [place]    Feeling of Stress : To some extent  Social Connections: Moderately Isolated (12/06/2020)   Social Connection and Isolation Panel [NHANES]    Frequency of Communication with Friends and Family: More than three times a week    Frequency of Social Gatherings with Friends and Family: Once a week    Attends Religious Services: Never    Marine scientist or Organizations: No    Attends Archivist Meetings: Never    Marital Status: Married  Human resources officer Violence: Not At Risk (12/06/2020)   Humiliation, Afraid, Rape, and Kick questionnaire    Fear of Current or Ex-Partner: No    Emotionally Abused: No    Physically Abused: No    Sexually Abused: No     Current Outpatient Medications:    celecoxib (CELEBREX) 200 MG capsule, Take 1 capsule twice a day by oral route as needed., Disp: 60 capsule, Rfl: 0   meloxicam (MOBIC) 15 MG tablet, Take 1 tablet every day by oral route with meals for 30 days., Disp: 30 tablet, Rfl: 0   methylPREDNISolone (MEDROL) 4 MG TBPK tablet, Take 1 dose pk by oral route., Disp: 21 tablet, Rfl: 0   traZODone (DESYREL) 50 MG tablet, TAKE 1 TABLET BY MOUTH AT BEDTIME AS NEEDED FOR SLEEP., Disp: 90 tablet, Rfl: 1  No Known  Allergies   ROS  Constitutional: Negative for fever or weight change.  Respiratory: Negative for cough and shortness of breath.   Cardiovascular: Negative for chest pain or palpitations.  Gastrointestinal: Negative for abdominal pain, no bowel changes.  Musculoskeletal: Negative for gait problem or joint swelling.  Skin: positive for hives  Neurological: Negative for dizziness or headache.  No other specific complaints in a complete review of systems (except as listed in HPI above).   Objective  Vitals:   06/24/22 1338  BP: (!) 142/96  Pulse: (!) 102  Resp: 18  Temp: 97.7 F (36.5 C)  SpO2: 96%  Weight: 170 lb 12.8 oz (77.5 kg)  Height: 5' 6"  (1.676 m)    Body mass index is 27.57 kg/m.  Physical Exam  Constitutional: Patient appears well-developed and well-nourished. No distress.  HENT: Head: Normocephalic and atraumatic. Ears: B TMs ok, no erythema or effusion; Nose: Nose normal. Mouth/Throat: Oropharynx is clear and moist. No oropharyngeal exudate.  Eyes: Conjunctivae and EOM are normal. Pupils are equal, round, and reactive to light. No scleral icterus.  Neck: Normal range of motion. Neck supple. No JVD present. No thyromegaly present.  Cardiovascular: Normal rate, regular rhythm and normal heart sounds.  No murmur heard. No BLE edema. Pulmonary/Chest: Effort normal and breath sounds normal. No respiratory distress. Abdominal: Soft. Bowel sounds are normal, no distension. There is no tenderness. no masses Breast: no lumps or masses, no nipple discharge or rashes FEMALE GENITALIA:  External genitalia normal External urethra normal Vaginal vault normal without discharge or lesions Cervix normal without discharge or lesions Bimanual exam normal without masses RECTAL: not done  Musculoskeletal: Normal range of motion,  no joint effusions. No gross deformities Neurological: he is alert and oriented to person, place, and time. No cranial nerve deficit. Coordination,  balance, strength, speech and gait are normal.  Skin: hives , photos attached  Psychiatric: Patient has a normal mood and affect. behavior is normal. Judgment and thought content normal.   Fall Risk:    06/24/2022    1:39 PM 12/06/2020    3:02 PM 07/22/2020    7:48 AM 12/05/2019    3:28 PM 11/30/2018    3:46 PM  Fall Risk   Falls in the past year? 0 0 0 0 0  Number falls in past yr: 0 0 0 0   Injury with Fall? 0 0 0 0   Follow up   Falls evaluation completed       Functional Status Survey: Is the patient deaf or have difficulty hearing?: No Does the patient have difficulty seeing, even when wearing glasses/contacts?: No Does the patient have difficulty concentrating, remembering, or making decisions?: No Does the patient have difficulty walking or climbing stairs?: No Does the patient have difficulty dressing or bathing?: No Does the patient have difficulty doing errands alone such as visiting a doctor's office or shopping?: No   Assessment & Plan  1. Well adult exam  - Lipid panel - COMPLETE METABOLIC PANEL WITH GFR - CBC with Differential/Platelet - Hemoglobin A1c  2. Cervical cancer screening  - Cytology - PAP  3. Hives  - dexamethasone (DECADRON) injection 10 mg - loratadine (CLARITIN) 10 MG tablet; Take 1 tablet (10 mg total) by mouth 2 (two) times daily.  Dispense: 60 tablet; Refill: 0 - hydrOXYzine (ATARAX) 25 MG tablet; Take 1-2 tablets (25-50 mg total) by mouth 3 (three) times daily as needed. In place of benadryl  Dispense: 30 tablet; Refill: 0 - famotidine (PEPCID) 20 MG tablet; Take 1 tablet (20 mg total) by mouth 2 (two) times daily.  Dispense: 60 tablet; Refill: 0 - Food Allergy Profile  4. Breast cancer screening by mammogram  - MM 3D SCREEN BREAST BILATERAL; Future  5. History of stress fracture  - DG Bone Density; Future  6. Osteoporosis screening  - DG Bone Density; Future   -USPSTF grade A and B recommendations reviewed with patient;  age-appropriate recommendations, preventive care, screening tests, etc discussed and encouraged; healthy living encouraged; see AVS for patient education given to patient -Discussed importance of 150 minutes of physical activity weekly, eat two servings of fish weekly, eat one serving of tree nuts ( cashews, pistachios, pecans, almonds.Marland Kitchen) every other day, eat 6 servings of fruit/vegetables daily and drink plenty of water and avoid sweet beverages.   -Reviewed Health Maintenance: Yes.

## 2022-06-24 ENCOUNTER — Other Ambulatory Visit: Payer: Self-pay

## 2022-06-24 ENCOUNTER — Ambulatory Visit (INDEPENDENT_AMBULATORY_CARE_PROVIDER_SITE_OTHER): Payer: 59 | Admitting: Family Medicine

## 2022-06-24 ENCOUNTER — Encounter: Payer: Self-pay | Admitting: Family Medicine

## 2022-06-24 ENCOUNTER — Other Ambulatory Visit (HOSPITAL_COMMUNITY)
Admission: RE | Admit: 2022-06-24 | Discharge: 2022-06-24 | Disposition: A | Discharge status: Home / Self Care | Payer: 59 | Source: Ambulatory Visit | Attending: Family Medicine | Admitting: Family Medicine

## 2022-06-24 VITALS — BP 142/96 | HR 102 | Temp 97.7°F | Resp 18 | Ht 66.0 in | Wt 170.8 lb

## 2022-06-24 DIAGNOSIS — Z87312 Personal history of (healed) stress fracture: Secondary | ICD-10-CM | POA: Diagnosis not present

## 2022-06-24 DIAGNOSIS — Z13820 Encounter for screening for osteoporosis: Secondary | ICD-10-CM | POA: Diagnosis not present

## 2022-06-24 DIAGNOSIS — Z1231 Encounter for screening mammogram for malignant neoplasm of breast: Secondary | ICD-10-CM | POA: Diagnosis not present

## 2022-06-24 DIAGNOSIS — L509 Urticaria, unspecified: Secondary | ICD-10-CM | POA: Diagnosis not present

## 2022-06-24 DIAGNOSIS — Z124 Encounter for screening for malignant neoplasm of cervix: Secondary | ICD-10-CM | POA: Insufficient documentation

## 2022-06-24 DIAGNOSIS — Z0000 Encounter for general adult medical examination without abnormal findings: Secondary | ICD-10-CM | POA: Diagnosis not present

## 2022-06-24 MED ORDER — LORATADINE 10 MG PO TABS
10.0000 mg | ORAL_TABLET | Freq: Two times a day (BID) | ORAL | 0 refills | Status: DC
  Filled 2022-06-24: qty 60, 30d supply, fill #0

## 2022-06-24 MED ORDER — FAMOTIDINE 20 MG PO TABS
20.0000 mg | ORAL_TABLET | Freq: Two times a day (BID) | ORAL | 0 refills | Status: DC
  Filled 2022-06-24: qty 60, 30d supply, fill #0

## 2022-06-24 MED ORDER — DEXAMETHASONE SODIUM PHOSPHATE 10 MG/ML IJ SOLN
10.0000 mg | Freq: Once | INTRAMUSCULAR | Status: AC
  Administered 2022-06-24: 10 mg via INTRAMUSCULAR

## 2022-06-24 MED ORDER — HYDROXYZINE HCL 25 MG PO TABS
25.0000 mg | ORAL_TABLET | Freq: Three times a day (TID) | ORAL | 0 refills | Status: DC | PRN
  Filled 2022-06-24: qty 30, 5d supply, fill #0

## 2022-06-25 LAB — COMPLETE METABOLIC PANEL WITH GFR
AG Ratio: 1.8 (calc) (ref 1.0–2.5)
ALT: 16 U/L (ref 6–29)
AST: 14 U/L (ref 10–35)
Albumin: 4.4 g/dL (ref 3.6–5.1)
Alkaline phosphatase (APISO): 102 U/L (ref 37–153)
BUN: 17 mg/dL (ref 7–25)
CO2: 25 mmol/L (ref 20–32)
Calcium: 9.7 mg/dL (ref 8.6–10.4)
Chloride: 106 mmol/L (ref 98–110)
Creat: 0.89 mg/dL (ref 0.50–1.03)
Globulin: 2.5 g/dL (calc) (ref 1.9–3.7)
Glucose, Bld: 102 mg/dL — ABNORMAL HIGH (ref 65–99)
Potassium: 4.1 mmol/L (ref 3.5–5.3)
Sodium: 142 mmol/L (ref 135–146)
Total Bilirubin: 0.4 mg/dL (ref 0.2–1.2)
Total Protein: 6.9 g/dL (ref 6.1–8.1)
eGFR: 78 mL/min/{1.73_m2} (ref >=60)

## 2022-06-25 LAB — FOOD ALLERGY PROFILE
Allergen, Salmon, f41: <0.1 kU/L
Almonds: <0.1 kU/L
CLASS: 0
CLASS: 0
CLASS: 0
CLASS: 0
CLASS: 0
CLASS: 0
CLASS: 0
CLASS: 0
CLASS: 0
CLASS: 0
CLASS: 3
Cashew IgE: <0.1 kU/L
Class: 0
Class: 0
Class: 0
Class: 0
Egg White IgE: <0.1 kU/L
Fish Cod: <0.1 kU/L
Hazelnut: 3.58 kU/L — ABNORMAL HIGH
Milk IgE: <0.1 kU/L
Peanut IgE: <0.1 kU/L
Scallop IgE: <0.1 kU/L
Sesame Seed f10: <0.1 kU/L
Shrimp IgE: <0.1 kU/L
Soybean IgE: <0.1 kU/L
Tuna IgE: <0.1 kU/L
Walnut: <0.1 kU/L
Wheat IgE: <0.1 kU/L

## 2022-06-25 LAB — CBC WITH DIFFERENTIAL/PLATELET
Absolute Monocytes: 456 cells/uL (ref 200–950)
Basophils Absolute: 39 cells/uL (ref 0–200)
Basophils Relative: 0.8 %
Eosinophils Absolute: 176 cells/uL (ref 15–500)
Eosinophils Relative: 3.6 %
HCT: 44.3 % (ref 35.0–45.0)
Hemoglobin: 15 g/dL (ref 11.7–15.5)
Lymphs Abs: 1450 cells/uL (ref 850–3900)
MCH: 30.2 pg (ref 27.0–33.0)
MCHC: 33.9 g/dL (ref 32.0–36.0)
MCV: 89.3 fL (ref 80.0–100.0)
MPV: 11.5 fL (ref 7.5–12.5)
Monocytes Relative: 9.3 %
Neutro Abs: 2778 cells/uL (ref 1500–7800)
Neutrophils Relative %: 56.7 %
Platelets: 239 10*3/uL (ref 140–400)
RBC: 4.96 10*6/uL (ref 3.80–5.10)
RDW: 12.1 % (ref 11.0–15.0)
Total Lymphocyte: 29.6 %
WBC: 4.9 10*3/uL (ref 3.8–10.8)

## 2022-06-25 LAB — HEMOGLOBIN A1C
Hgb A1c MFr Bld: 4.7 % of total Hgb (ref <5.7)
Mean Plasma Glucose: 88 mg/dL
eAG (mmol/L): 4.9 mmol/L

## 2022-06-25 LAB — LIPID PANEL
Cholesterol: 240 mg/dL — ABNORMAL HIGH (ref <200)
HDL: 53 mg/dL (ref >=50)
LDL Cholesterol (Calc): 150 mg/dL (calc) — ABNORMAL HIGH
Non-HDL Cholesterol (Calc): 187 mg/dL (calc) — ABNORMAL HIGH (ref <130)
Total CHOL/HDL Ratio: 4.5 (calc) (ref <5.0)
Triglycerides: 231 mg/dL — ABNORMAL HIGH (ref <150)

## 2022-06-25 LAB — INTERPRETATION:

## 2022-06-26 ENCOUNTER — Other Ambulatory Visit: Payer: Self-pay

## 2022-06-26 ENCOUNTER — Other Ambulatory Visit: Payer: Self-pay | Admitting: Family Medicine

## 2022-06-26 DIAGNOSIS — Z91018 Allergy to other foods: Secondary | ICD-10-CM

## 2022-06-26 MED ORDER — EPINEPHRINE 0.3 MG/0.3ML IJ SOAJ
0.3000 mg | INTRAMUSCULAR | 0 refills | Status: AC | PRN
  Filled 2022-06-26: qty 2, 30d supply, fill #0

## 2022-06-29 LAB — CYTOLOGY - PAP
Comment: NEGATIVE
Diagnosis: NEGATIVE
High risk HPV: NEGATIVE

## 2022-07-22 ENCOUNTER — Ambulatory Visit
Admission: RE | Admit: 2022-07-22 | Discharge: 2022-07-22 | Disposition: A | Discharge status: Home / Self Care | Payer: 59 | Source: Ambulatory Visit | Attending: Family Medicine | Admitting: Family Medicine

## 2022-07-22 DIAGNOSIS — Z87312 Personal history of (healed) stress fracture: Secondary | ICD-10-CM | POA: Insufficient documentation

## 2022-07-22 DIAGNOSIS — M8589 Other specified disorders of bone density and structure, multiple sites: Secondary | ICD-10-CM | POA: Diagnosis not present

## 2022-07-22 DIAGNOSIS — Z1231 Encounter for screening mammogram for malignant neoplasm of breast: Secondary | ICD-10-CM | POA: Insufficient documentation

## 2022-07-22 DIAGNOSIS — Z13820 Encounter for screening for osteoporosis: Secondary | ICD-10-CM | POA: Diagnosis not present

## 2023-04-13 ENCOUNTER — Ambulatory Visit (INDEPENDENT_AMBULATORY_CARE_PROVIDER_SITE_OTHER): Payer: 59 | Admitting: Family Medicine

## 2023-04-13 ENCOUNTER — Other Ambulatory Visit: Payer: Self-pay

## 2023-04-13 ENCOUNTER — Encounter: Payer: Self-pay | Admitting: Family Medicine

## 2023-04-13 VITALS — BP 134/86 | HR 94 | Resp 16 | Ht 67.0 in | Wt 167.0 lb

## 2023-04-13 DIAGNOSIS — N951 Menopausal and female climacteric states: Secondary | ICD-10-CM | POA: Diagnosis not present

## 2023-04-13 DIAGNOSIS — Z23 Encounter for immunization: Secondary | ICD-10-CM | POA: Diagnosis not present

## 2023-04-13 MED ORDER — VENLAFAXINE HCL ER 75 MG PO CP24
75.0000 mg | ORAL_CAPSULE | Freq: Every day | ORAL | 0 refills | Status: DC
  Filled 2023-04-13 (×2): qty 90, 90d supply, fill #0

## 2023-04-13 MED ORDER — VENLAFAXINE HCL ER 37.5 MG PO CP24
37.5000 mg | ORAL_CAPSULE | Freq: Every day | ORAL | 0 refills | Status: DC
  Filled 2023-04-13 (×2): qty 30, 30d supply, fill #0

## 2023-04-13 MED ORDER — CLONIDINE HCL 0.1 MG PO TABS
0.1000 mg | ORAL_TABLET | Freq: Every evening | ORAL | 0 refills | Status: DC
  Filled 2023-04-13 (×3): qty 90, 90d supply, fill #0

## 2023-04-13 NOTE — Progress Notes (Signed)
Name: Donna Wyatt   MRN: [id number]    DOB: Apr 03, 1971   Date:04/13/2023       Progress Note  Subjective  Chief Complaint  Hot Flashes  HPI  Hot Flashes: she had a hysterectomy a long time ago and over the past year she has noticed increase in hot flashes and night sweats . Symptoms are getting worse and she would like help. Discussed SSRI, clonidine and estrogen replacement therapy. Possible side effects and how to take it, she would like to try Effexor and prn clonidine for sleep   Patient Active Problem List   Diagnosis Date Noted   Seasonal affective disorder ([facility]) 12/06/2020   Diverticulosis 06/18/2015   History of hysterectomy, supracervical 06/18/2015   Breast lump in female 06/17/2015   Chronic constipation 06/17/2015   Cephalalgia 06/17/2015    Past Surgical History:  Procedure Laterality Date   ABDOMINAL HYSTERECTOMY  2008   Partial   COLONOSCOPY WITH PROPOFOL N/A 07/21/2019   Procedure: COLONOSCOPY WITH PROPOFOL;  Surgeon: Wyline Mood, MD;  Location: [facility] ENDOSCOPY;  Service: Gastroenterology;  Laterality: N/A;    Family History  Problem Relation Age of Onset   Diabetes Mother    Hyperlipidemia Father    Hypertension Father    Glaucoma Father    Hyperlipidemia Sister    Hypertension Sister    Asthma Sister    Fibroids Sister    Breast cancer Neg Hx     Social History   Tobacco Use   Smoking status: Never   Smokeless tobacco: Never  Substance Use Topics   Alcohol use: Yes    Alcohol/week: 0.0 standard drinks of alcohol    Comment: occasionally     Current Outpatient Medications:    EPINEPHrine 0.3 mg/0.3 mL IJ SOAJ injection, Inject 0.3 mg into the muscle as needed for anaphylaxis., Disp: 2 each, Rfl: 0   famotidine (PEPCID) 20 MG tablet, Take 1 tablet (20 mg total) by mouth 2 (two) times daily., Disp: 60 tablet, Rfl: 0   hydrOXYzine (ATARAX) 25 MG tablet, Take 1-2 tablets (25-50 mg total) by mouth 3 (three) times daily as needed. In place of  benadryl, Disp: 30 tablet, Rfl: 0   loratadine (CLARITIN) 10 MG tablet, Take 1 tablet (10 mg total) by mouth 2 (two) times daily., Disp: 60 tablet, Rfl: 0   traZODone (DESYREL) 50 MG tablet, TAKE 1 TABLET BY MOUTH AT BEDTIME AS NEEDED FOR SLEEP., Disp: 90 tablet, Rfl: 1  No Known Allergies  I personally reviewed active problem list, medication list, allergies, family history, social history, health maintenance with the patient/caregiver today.   ROS  Ten systems reviewed and is negative except as mentioned in HPI    Objective  Vitals:   04/13/23 0952  BP: 134/86  Pulse: 94  Resp: 16  SpO2: 97%  Weight: 167 lb (75.8 kg)  Height: 5\' 7"  (1.702 m)    Body mass index is 26.16 kg/m.  Physical Exam  Constitutional: Patient appears well-developed and well-nourished. No distress.  HEENT: head atraumatic, normocephalic, pupils equal and reactive to light, neck supple Cardiovascular: Normal rate, regular rhythm and normal heart sounds.  No murmur heard. No BLE edema. Pulmonary/Chest: Effort normal and breath sounds normal. No respiratory distress. Abdominal: Soft.  There is no tenderness. Psychiatric: Patient has a normal mood and affect. behavior is normal. Judgment and thought content normal.   PHQ2/9:    04/13/2023    9:52 AM 06/24/2022    1:39 PM 12/06/2020    3:02  PM 07/22/2020    7:52 AM 07/22/2020    7:48 AM  Depression screen PHQ 2/9  Decreased Interest 0 0 0 2 0  Down, Depressed, Hopeless 0 0 0 2 0  PHQ - 2 Score 0 0 0 4 0  Altered sleeping 0 0 2 2   Tired, decreased energy 0 0 2 2   Change in appetite 0 0 0 0   Feeling bad or failure about yourself  0 0 0 2   Trouble concentrating 0 0 0 2   Moving slowly or fidgety/restless 0 0 0 0   Suicidal thoughts 0 0 0 0   PHQ-9 Score 0 0 4 12   Difficult doing work/chores  Not difficult at all  Somewhat difficult     phq 9 is negative   Fall Risk:    04/13/2023    9:52 AM 06/24/2022    1:39 PM 12/06/2020    3:02 PM  07/22/2020    7:48 AM 12/05/2019    3:28 PM  Fall Risk   Falls in the past year? 0 0 0 0 0  Number falls in past yr: 0 0 0 0 0  Injury with Fall? 0 0 0 0 0  Risk for fall due to : No Fall Risks      Follow up Falls prevention discussed   Falls evaluation completed       Functional Status Survey: Is the patient deaf or have difficulty hearing?: No Does the patient have difficulty seeing, even when wearing glasses/contacts?: No Does the patient have difficulty concentrating, remembering, or making decisions?: No Does the patient have difficulty walking or climbing stairs?: No Does the patient have difficulty dressing or bathing?: No Does the patient have difficulty doing errands alone such as visiting a doctor's office or shopping?: No    Assessment & Plan  1. Hot flashes due to menopause   - venlafaxine XR (EFFEXOR XR) 37.5 MG 24 hr capsule; Take 1 capsule (37.5 mg total) by mouth daily with breakfast.  Dispense: 30 capsule; Refill: 0 - venlafaxine XR (EFFEXOR XR) 75 MG 24 hr capsule; Take 1 capsule (75 mg total) by mouth daily with breakfast.  Dispense: 90 capsule; Refill: 0 - cloNIDine (CATAPRES) 0.1 MG tablet; Take 1 tablet (0.1 mg total) by mouth at bedtime.  Dispense: 90 tablet; Refill: 0   2. Need for Tdap vaccination  We are out, but she will get it during her CPE

## 2023-04-30 ENCOUNTER — Other Ambulatory Visit: Payer: Self-pay

## 2023-06-11 NOTE — Progress Notes (Unsigned)
Name: Donna Wyatt   MRN: [id number]    DOB: 1971-08-06   Date:06/14/2023       Progress Note  Subjective  Chief Complaint  Follow up  HPI  Hot Flashes: she had a hysterectomy a long time ago and over the past year she  noticed increase in hot flashes and night sweats, she came in July 2024 and we gave her Effexor 37.5 mg and 75 mg, she is taking the lower dose and symptoms have improved, she still waves of hot flashes but no longer perspiring profusely also sleeping slightly better at night. Clonidine did not work. She is taking old Trazodone prn for sleepy .   Foot pain / right: started one week ago , no change in activity prior to onset of symptoms. She woke up one day with pain, worse when she first gets up in the morning and improves with activity, but returns after rest. She has been using ice and taking tylenol . Pain is intermittent and 3-4/10 described as sharp when she first to walk and after that it is a dull pain. No redness or swelling. Pain is posterior ankle and lateral foot. No pain with rom July when walking   Patient Active Problem List   Diagnosis Date Noted   Seasonal affective disorder (HCC) 12/06/2020   Diverticulosis 06/18/2015   History of hysterectomy, supracervical 06/18/2015   Breast lump in female 06/17/2015   Chronic constipation 06/17/2015   Cephalalgia 06/17/2015    Past Surgical History:  Procedure Laterality Date   ABDOMINAL HYSTERECTOMY  2008   without oophorectomy   COLONOSCOPY WITH PROPOFOL N/A 07/21/2019   Procedure: COLONOSCOPY WITH PROPOFOL;  Surgeon: Wyline Mood, MD;  Location: [facility] ENDOSCOPY;  Service: Gastroenterology;  Laterality: N/A;    Family History  Problem Relation Age of Onset   Diabetes Mother    Hyperlipidemia Father    Hypertension Father    Glaucoma Father    Hyperlipidemia Sister    Hypertension Sister    Asthma Sister    Fibroids Sister    Breast cancer Neg Hx     Social History   Tobacco Use   Smoking status:  Never   Smokeless tobacco: Never  Substance Use Topics   Alcohol use: Yes    Alcohol/week: 0.0 standard drinks of alcohol    Comment: occasionally     Current Outpatient Medications:    EPINEPHrine 0.3 mg/0.3 mL IJ SOAJ injection, Inject 0.3 mg into the muscle as needed for anaphylaxis., Disp: 2 each, Rfl: 0   venlafaxine XR (EFFEXOR XR) 75 MG 24 hr capsule, Take 1 capsule (75 mg total) by mouth daily with breakfast., Disp: 90 capsule, Rfl: 0  No Known Allergies  I personally reviewed active problem list, medication list, allergies, family history with the patient/caregiver today.   ROS  Ten systems reviewed and is negative except as mentioned in HPI    Objective  Vitals:   06/14/23 1346  BP: 128/76  Pulse: 91  Resp: 16  Temp: 97.8 F (36.6 C)  TempSrc: Oral  SpO2: 99%  Weight: 166 lb 6.4 oz (75.5 kg)  Height: 5\' 7"  (1.702 m)    Body mass index is 26.06 kg/m.  Physical Exam  Constitutional: Patient appears well-developed and well-nourished.  No distress.  HEENT: head atraumatic, normocephalic, pupils equal and reactive to light, neck supple Cardiovascular: Normal rate, regular rhythm and normal heart sounds.  No murmur heard. No BLE edema. Pulmonary/Chest: Effort normal and breath sounds normal. No respiratory distress.  Abdominal: Soft.  There is no tenderness. Muscular skeletal: no redness , some pain with palpation below lateral malleolus , no redness or increase in warmth, normal rom of ankle  Psychiatric: Patient has a normal mood and affect. behavior is normal. Judgment and thought content normal.   PHQ2/9:    04/13/2023    9:52 AM 06/24/2022    1:39 PM 12/06/2020    3:02 PM 07/22/2020    7:52 AM 07/22/2020    7:48 AM  Depression screen PHQ 2/9  Decreased Interest 0 0 0 2 0  Down, Depressed, Hopeless 0 0 0 2 0  PHQ - 2 Score 0 0 0 4 0  Altered sleeping 0 0 2 2   Tired, decreased energy 0 0 2 2   Change in appetite 0 0 0 0   Feeling bad or failure  about yourself  0 0 0 2   Trouble concentrating 0 0 0 2   Moving slowly or fidgety/restless 0 0 0 0   Suicidal thoughts 0 0 0 0   PHQ-9 Score 0 0 4 12   Difficult doing work/chores  Not difficult at all  Somewhat difficult     phq 9 is negative   Fall Risk:    06/14/2023    1:47 PM 04/13/2023    9:52 AM 06/24/2022    1:39 PM 12/06/2020    3:02 PM 07/22/2020    7:48 AM  Fall Risk   Falls in the past year? 0 0 0 0 0  Number falls in past yr:  0 0 0 0  Injury with Fall?  0 0 0 0  Risk for fall due to : No Fall Risks No Fall Risks     Follow up Falls prevention discussed Falls prevention discussed   Falls evaluation completed     Functional Status Survey: Is the patient deaf or have difficulty hearing?: No Does the patient have difficulty seeing, even when wearing glasses/contacts?: No Does the patient have difficulty concentrating, remembering, or making decisions?: No Does the patient have difficulty walking or climbing stairs?: No Does the patient have difficulty dressing or bathing?: No Does the patient have difficulty doing errands alone such as visiting a doctor's office or shopping?: No    Assessment & Plan  1. Hot flashes due to menopause  Continue Effexor, needs to go up on the dose since still has symptoms   2. Acute foot pain, right  - celecoxib (CELEBREX) 100 MG capsule; Take 1 capsule (100 mg total) by mouth 2 (two) times daily.  Dispense: 60 capsule; Refill: 0  She will call if symptoms do not resolve for referral to podiatrist   3. Other insomnia  - traZODone (DESYREL) 50 MG tablet; Take 0.5-1 tablets (25-50 mg total) by mouth at bedtime as needed for sleep.  Dispense: 90 tablet; Refill: 1

## 2023-06-14 ENCOUNTER — Encounter: Payer: Self-pay | Admitting: Family Medicine

## 2023-06-14 ENCOUNTER — Ambulatory Visit (INDEPENDENT_AMBULATORY_CARE_PROVIDER_SITE_OTHER): Payer: 59 | Admitting: Family Medicine

## 2023-06-14 ENCOUNTER — Other Ambulatory Visit: Payer: Self-pay

## 2023-06-14 VITALS — BP 128/76 | HR 91 | Temp 97.8°F | Resp 16 | Ht 67.0 in | Wt 166.4 lb

## 2023-06-14 DIAGNOSIS — G4709 Other insomnia: Secondary | ICD-10-CM

## 2023-06-14 DIAGNOSIS — N951 Menopausal and female climacteric states: Secondary | ICD-10-CM | POA: Diagnosis not present

## 2023-06-14 DIAGNOSIS — Z23 Encounter for immunization: Secondary | ICD-10-CM

## 2023-06-14 DIAGNOSIS — M79671 Pain in right foot: Secondary | ICD-10-CM | POA: Diagnosis not present

## 2023-06-14 MED ORDER — TRAZODONE HCL 50 MG PO TABS
25.0000 mg | ORAL_TABLET | Freq: Every evening | ORAL | 1 refills | Status: DC | PRN
  Filled 2023-06-14: qty 90, 90d supply, fill #0

## 2023-06-14 MED ORDER — CELECOXIB 100 MG PO CAPS
100.0000 mg | ORAL_CAPSULE | Freq: Two times a day (BID) | ORAL | 0 refills | Status: AC
  Filled 2023-06-14: qty 60, 30d supply, fill #0

## 2023-08-04 ENCOUNTER — Encounter: Payer: 59 | Admitting: Family Medicine

## 2023-10-04 ENCOUNTER — Encounter: Payer: Self-pay | Admitting: Family Medicine

## 2023-10-04 ENCOUNTER — Ambulatory Visit (INDEPENDENT_AMBULATORY_CARE_PROVIDER_SITE_OTHER): Payer: 59 | Admitting: Family Medicine

## 2023-10-04 VITALS — BP 146/82 | HR 100 | Resp 16 | Ht 67.0 in | Wt 168.1 lb

## 2023-10-04 DIAGNOSIS — R739 Hyperglycemia, unspecified: Secondary | ICD-10-CM | POA: Diagnosis not present

## 2023-10-04 DIAGNOSIS — E7800 Pure hypercholesterolemia, unspecified: Secondary | ICD-10-CM | POA: Diagnosis not present

## 2023-10-04 DIAGNOSIS — R030 Elevated blood-pressure reading, without diagnosis of hypertension: Secondary | ICD-10-CM | POA: Insufficient documentation

## 2023-10-04 DIAGNOSIS — Z0000 Encounter for general adult medical examination without abnormal findings: Secondary | ICD-10-CM | POA: Diagnosis not present

## 2023-10-04 DIAGNOSIS — Z1231 Encounter for screening mammogram for malignant neoplasm of breast: Secondary | ICD-10-CM

## 2023-10-04 DIAGNOSIS — Z23 Encounter for immunization: Secondary | ICD-10-CM

## 2023-10-04 NOTE — Patient Instructions (Signed)
 Donna Wyatt

## 2023-10-04 NOTE — Progress Notes (Signed)
 Name: Donna Wyatt   MRN: [id number]    DOB: [birth year]   Date:10/04/2023       Progress Note  Subjective  Chief Complaint  Chief Complaint  Patient presents with   Annual Exam    HPI  Patient presents for annual CPE.  Diet: she bakes her food, not a lot of salt since her husband cannot eat salt  Exercise: she walks 20-30 minutes daily   Last Eye Exam: she is due Last Dental Exam: completed  Flowsheet Row Office Visit from 10/04/2023 in [facility]  AUDIT-C Score 1      Depression: Phq 9 is  negative    10/04/2023    3:37 PM 04/13/2023    9:52 AM 06/24/2022    1:39 PM 12/06/2020    3:02 PM 07/22/2020    7:52 AM  Depression screen PHQ 2/9  Decreased Interest 0 0 0 0 2  Down, Depressed, Hopeless 0 0 0 0 2  PHQ - 2 Score 0 0 0 0 4  Altered sleeping 0 0 0 2 2  Tired, decreased energy 0 0 0 2 2  Change in appetite 0 0 0 0 0  Feeling bad or failure about yourself  0 0 0 0 2  Trouble concentrating 0 0 0 0 2  Moving slowly or fidgety/restless 0 0 0 0 0  Suicidal thoughts 0 0 0 0 0  PHQ-9 Score 0 0 0 4 12  Difficult doing work/chores Not difficult at all  Not difficult at all  Somewhat difficult   Hypertension: BP Readings from Last 3 Encounters:  10/04/23 (!) 146/82  06/14/23 128/76  04/13/23 134/86   Obesity: Wt Readings from Last 3 Encounters:  10/04/23 168 lb 1.6 oz (76.2 kg)  06/14/23 166 lb 6.4 oz (75.5 kg)  04/13/23 167 lb (75.8 kg)   BMI Readings from Last 3 Encounters:  10/04/23 26.33 kg/m  06/14/23 26.06 kg/m  04/13/23 26.16 kg/m     Vaccines:  RSV: not applicable HPV: not applicable Tdap: getting today Shingrix: encouraged to get at the pharmacy Pneumonia: not applicable Flu: completed COVID-19: declined booster   Hep C Screening: completed STD testing and prevention (HIV/chl/gon/syphilis): N/A Intimate partner violence: negative screen  Sexual History : no pain or discomfort  Menstrual History/LMP/Abnormal  Bleeding: hysterectomy supra cervical  Discussed importance of follow up if any post-menopausal bleeding: yes  Incontinence Symptoms: she has to rush at times  Breast cancer:  - Last Mammogram: 06/2022, recheck it yearly  - BRCA gene screening: N/A  Osteoporosis Prevention : Discussed high calcium and vitamin D  supplementation, weight bearing exercises Bone density :not applicable   Cervical cancer screening: up-to-date  Skin cancer: Discussed monitoring for atypical lesions  Colorectal cancer: repeat in 2030   Lung cancer:  Low Dose CT Chest recommended if Age 41-80 years, 20 pack-year currently smoking OR have quit w/in 15years. Patient does not qualify for screen   ECG: 2011  Advanced Care Planning: A voluntary discussion about advance care planning including the explanation and discussion of advance directives.  Discussed health care proxy and Living will, and the patient was able to identify a health care proxy as husband .  Patient does have a living will and power of attorney of health care   Patient Active Problem List   Diagnosis Date Noted   Seasonal affective disorder (HCC) 12/06/2020   Diverticulosis 06/18/2015   History of hysterectomy, supracervical 06/18/2015   Breast lump in female 06/17/2015  Chronic constipation 06/17/2015   Cephalalgia 06/17/2015    Past Surgical History:  Procedure Laterality Date   COLONOSCOPY WITH PROPOFOL  N/A 07/21/2019   Procedure: COLONOSCOPY WITH PROPOFOL ;  Surgeon: Therisa Bi, MD;  Location: [facility] ENDOSCOPY;  Service: Gastroenterology;  Laterality: N/A;   LAPAROSCOPIC SUPRACERVICAL HYSTERECTOMY  2008   without oophorectomy    Family History  Problem Relation Age of Onset   Diabetes Mother    Hyperlipidemia Father    Hypertension Father    Glaucoma Father    Hyperlipidemia Sister    Hypertension Sister    Asthma Sister    Fibroids Sister    Breast cancer Neg Hx     Social History   Socioeconomic History   Marital  status: Married    Spouse name: Prentice   Number of children: 2   Years of education: Not on file   Highest education level: Associate degree: academic program  Occupational History   Not on file  Tobacco Use   Smoking status: Never   Smokeless tobacco: Never  Vaping Use   Vaping status: Never Used  Substance and Sexual Activity   Alcohol use: Yes    Alcohol/week: 0.0 standard drinks of alcohol    Comment: occasionally   Drug use: No   Sexual activity: Yes    Partners: Male    Birth control/protection: Surgical  Other Topics Concern   Not on file  Social History Narrative   Not on file   Social Drivers of Health   Financial Resource Strain: Low Risk  (10/04/2023)   Overall Financial Resource Strain (CARDIA)    Difficulty of Paying Living Expenses: Not hard at all  Food Insecurity: No Food Insecurity (10/04/2023)   Hunger Vital Sign    Worried About Running Out of Food in the Last Year: Never true    Ran Out of Food in the Last Year: Never true  Transportation Needs: No Transportation Needs (10/04/2023)   PRAPARE - Administrator, Civil Service (Medical): No    Lack of Transportation (Non-Medical): No  Physical Activity: Insufficiently Active (10/04/2023)   Exercise Vital Sign    Days of Exercise per Week: 5 days    Minutes of Exercise per Session: 20 min  Stress: No Stress Concern Present (10/04/2023)   [employer] of Occupational Health - Occupational Stress Questionnaire    Feeling of Stress : Only a little  Social Connections: Moderately Integrated (10/04/2023)   Social Connection and Isolation Panel [NHANES]    Frequency of Communication with Friends and Family: More than three times a week    Frequency of Social Gatherings with Friends and Family: More than three times a week    Attends Religious Services: More than 4 times per year    Active Member of [employer] or Organizations: No    Attends Banker Meetings: Never    Marital Status: Married   Catering Manager Violence: Not At Risk (10/04/2023)   Humiliation, Afraid, Rape, and Kick questionnaire    Fear of Current or Ex-Partner: No    Emotionally Abused: No    Physically Abused: No    Sexually Abused: No     Current Outpatient Medications:    celecoxib  (CELEBREX ) 100 MG capsule, Take 1 capsule (100 mg total) by mouth 2 (two) times daily., Disp: 60 capsule, Rfl: 0   EPINEPHrine  0.3 mg/0.3 mL IJ SOAJ injection, Inject 0.3 mg into the muscle as needed for anaphylaxis., Disp: 2 each, Rfl: 0   traZODone  (DESYREL )  50 MG tablet, Take 0.5-1 tablet (25-50 mg total) by mouth at bedtime as needed for sleep., Disp: 90 tablet, Rfl: 1   venlafaxine  XR (EFFEXOR  XR) 75 MG 24 hr capsule, Take 1 capsule (75 mg total) by mouth daily with breakfast., Disp: 90 capsule, Rfl: 0  No Known Allergies   ROS  Constitutional: Negative for fever or weight change.  Respiratory: Negative for cough and shortness of breath.   Cardiovascular: Negative for chest pain or palpitations.  Gastrointestinal: Negative for abdominal pain, no bowel changes.  Musculoskeletal: Negative for gait problem or joint swelling.  Skin: Negative for rash.  Neurological: Negative for dizziness or headache.  No other specific complaints in a complete review of systems (except as listed in HPI above).   Objective  Vitals:   10/04/23 1542 10/04/23 1600 10/04/23 1631  BP: (!) 144/92 (!) 142/88 (!) 146/82  Pulse: 100    Resp: 16    SpO2: 98%    Weight: 168 lb 1.6 oz (76.2 kg)    Height: 5' 7 (1.702 m)      Body mass index is 26.33 kg/m.  Physical Exam  Constitutional: Patient appears well-developed and well-nourished. No distress.  HENT: Head: Normocephalic and atraumatic. Ears: B TMs ok, no erythema or effusion; Nose: Nose normal. Mouth/Throat: Oropharynx is clear and moist. No oropharyngeal exudate.  Eyes: Conjunctivae and EOM are normal. Pupils are equal, round, and reactive to light. No scleral icterus.  Neck:  Normal range of motion. Neck supple. No JVD present. No thyromegaly present.  Cardiovascular: Normal rate, regular rhythm and normal heart sounds.  No murmur heard. No BLE edema. Pulmonary/Chest: Effort normal and breath sounds normal. No respiratory distress. Abdominal: Soft. Bowel sounds are normal, no distension. There is no tenderness. no masses Breast: no lumps or masses, no nipple discharge or rashes FEMALE GENITALIA:  Not done  RECTAL: not done Musculoskeletal: Normal range of motion, no joint effusions. No gross deformities Neurological: he is alert and oriented to person, place, and time. No cranial nerve deficit. Coordination, balance, strength, speech and gait are normal.  Skin: Skin is warm and dry. No rash noted. No erythema.  Psychiatric: Patient has a normal mood and affect. behavior is normal. Judgment and thought content normal.     Assessment & Plan  1. Well adult exam (Primary)  - MM 3D SCREENING MAMMOGRAM BILATERAL BREAST; Future - Tdap vaccine greater than or equal to 7yo IM - Lipid panel - CBC with Differential/Platelet - COMPLETE METABOLIC PANEL WITH GFR - TSH - Hemoglobin A1c  2. Encounter for screening mammogram for malignant neoplasm of breast  - MM 3D SCREENING MAMMOGRAM BILATERAL BREAST; Future  3. Need for Tdap vaccination  - Tdap vaccine greater than or equal to 7yo IM  4. Elevated BP without diagnosis of hypertension  - CBC with Differential/Platelet - COMPLETE METABOLIC PANEL WITH GFR - TSH  Return in one week for bp check if still high return for follow up to initiate medication for hypertension , she will also need an EKG   5. Hypercholesteremia  - Lipid panel  6. Hyperglycemia  - Hemoglobin A1c    -USPSTF grade A and B recommendations reviewed with patient; age-appropriate recommendations, preventive care, screening tests, etc discussed and encouraged; healthy living encouraged; see AVS for patient education given to  patient -Discussed importance of 150 minutes of physical activity weekly, eat two servings of fish weekly, eat one serving of tree nuts ( cashews, pistachios, pecans, almonds.SABRA) every other day, eat  6 servings of fruit/vegetables daily and drink plenty of water and avoid sweet beverages.   -Reviewed Health Maintenance: Yes.

## 2023-10-11 ENCOUNTER — Ambulatory Visit: Payer: 59

## 2023-10-19 ENCOUNTER — Other Ambulatory Visit: Payer: Self-pay

## 2023-10-19 ENCOUNTER — Encounter: Payer: Self-pay | Admitting: Internal Medicine

## 2023-10-19 ENCOUNTER — Ambulatory Visit: Payer: 59

## 2023-10-19 ENCOUNTER — Ambulatory Visit (INDEPENDENT_AMBULATORY_CARE_PROVIDER_SITE_OTHER): Payer: 59 | Admitting: Internal Medicine

## 2023-10-19 VITALS — BP 128/88 | HR 83 | Temp 98.3°F | Resp 16 | Ht 67.0 in | Wt 165.7 lb

## 2023-10-19 DIAGNOSIS — R739 Hyperglycemia, unspecified: Secondary | ICD-10-CM | POA: Diagnosis not present

## 2023-10-19 DIAGNOSIS — H1132 Conjunctival hemorrhage, left eye: Secondary | ICD-10-CM

## 2023-10-19 DIAGNOSIS — Z0000 Encounter for general adult medical examination without abnormal findings: Secondary | ICD-10-CM | POA: Diagnosis not present

## 2023-10-19 DIAGNOSIS — E7800 Pure hypercholesterolemia, unspecified: Secondary | ICD-10-CM | POA: Diagnosis not present

## 2023-10-19 DIAGNOSIS — R030 Elevated blood-pressure reading, without diagnosis of hypertension: Secondary | ICD-10-CM | POA: Diagnosis not present

## 2023-10-19 NOTE — Patient Instructions (Signed)
Broken Blood Vessels in the Eye (Subconjunctival Hemorrhage): What to Know A subconjunctival hemorrhage happens when tiny blood vessels near the surface of your eye break and bleed. This creates a red patch on the white part of your eye. The redness may cover the entire white part of your eye or a small area. This is similar to a bruise. Although it might look bad, this injury is often harmless and doesn't affect your eyesight or cause pain. It often goes away on its own within 2-4 weeks. What are the causes? A subconjunctival hemorrhage may be caused by: Mild trauma, such as rubbing your eye too hard. Blunt injuries, such as ones that can happen during sports. Coughing, sneezing, or throwing up. Straining, such as when lifting a heavy object. Medical problems, such as: High blood pressure. Bleeding disorders. Diabetes. Recent eye surgery. Some medicines, especially blood thinners. This includes aspirin. Sometimes, this can happen without a clear cause. What are the signs or symptoms?  A bright or dark red patch on the white part of the eye. Swelling around the eye, if there's a lot of bleeding. Mild eye irritation. How is this diagnosed? Your health care provider may diagnose you based on your symptoms and a physical exam. If your injury was caused by trauma, your provider may have you see an eye specialist called an ophthalmologist. Other tests may include: An eye exam, including a vision test. A blood pressure check. Blood tests to check for bleeding disorders. X-rays or a CT scan to check for other injuries. How is this treated? Treatment isn't usually needed. If you have discomfort, your provider may recommend eye drops or putting cold, wet cloths on the affected eye. Follow these instructions at home: Take your medicines only as told. Use eye drops or apply a cold, wet cloth to help with discomfort as told. Avoid activities or places that bother or could injure your  eye. Contact a health care provider if: You have pain in your eye. The bleeding doesn't go away within 4 weeks. You keep getting new subconjunctival hemorrhages. Get help right away if: Your vision changes or you have double vision. You're suddenly sensitive to light. You have a very bad headache or keep throwing up. You're confused or more tired than normal. Your eye seems swollen or sticks out. You have unexplained bruises or bleeding in another area of your body. These symptoms may be an emergency. Call 911 right away. Do not wait to see if the symptoms will go away. Do not drive yourself to the hospital. This information is not intended to replace advice given to you by your health care provider. Make sure you discuss any questions you have with your health care provider. Document Revised: 05/10/2023 Document Reviewed: 05/10/2023 Elsevier Patient Education  2024 [employer].

## 2023-10-19 NOTE — Progress Notes (Signed)
   Acute Office Visit  Subjective:     Patient ID: Donna Wyatt, female    DOB: October 13, 1970, 53 y.o.   MRN: [id number]  Chief Complaint  Patient presents with   Hypertension   Eye Problem    Red, painful for 3 days    Hypertension Pertinent negatives include no blurred vision or headaches.  Eye Problem  Associated symptoms include eye redness. Pertinent negatives include no blurred vision, eye discharge, double vision, fever or photophobia.   Patient is in today for red, painful eye x 3 days and BP check.   RED EYE Duration: 3  days Involved eye:  left Onset: sudden - poked eye while putting on eyelashes No pain Foreign body sensation:no Visual impairment: no Eye redness: yes Discharge: no Crusting or matting of eyelids: no Swelling: no Photophobia: no Itching: no Tearing: no Eye trauma: yes  Review of Systems  Constitutional:  Negative for chills and fever.  Eyes:  Positive for redness. Negative for blurred vision, double vision, photophobia, pain and discharge.  Neurological:  Negative for dizziness and headaches.        Objective:    BP (!) 132/90 (Cuff Size: Large)   Pulse 83   Temp 98.3 F (36.8 C) (Oral)   Resp 16   Ht 5\' 7"  (1.702 m)   Wt 165 lb 11.2 oz (75.2 kg)   BMI 25.95 kg/m  BP Readings from Last 3 Encounters:  10/19/23 (!) 132/90  10/04/23 (!) 146/82  06/14/23 128/76   Wt Readings from Last 3 Encounters:  10/19/23 165 lb 11.2 oz (75.2 kg)  10/04/23 168 lb 1.6 oz (76.2 kg)  06/14/23 166 lb 6.4 oz (75.5 kg)      Physical Exam Constitutional:      Appearance: Normal appearance.  HENT:     Head: Normocephalic and atraumatic.  Eyes:     General: Lids are normal.     Extraocular Movements: Extraocular movements intact.     Conjunctiva/sclera:     Left eye: Hemorrhage present.     Pupils: Pupils are equal, round, and reactive to light.     Comments: Outer left anterior chamber hemorrhage   Cardiovascular:     Rate and Rhythm:  Normal rate and regular rhythm.  Pulmonary:     Effort: Pulmonary effort is normal.     Breath sounds: Normal breath sounds.  Skin:    General: Skin is warm and dry.  Neurological:     General: No focal deficit present.     Mental Status: She is alert. Mental status is at baseline.  Psychiatric:        Mood and Affect: Mood normal.        Behavior: Behavior normal.     No results found for any visits on 10/19/23.      Assessment & Plan:   1. Elevated BP without diagnosis of hypertension (Primary): BP here borderline, better on recheck. Patient has a cuff at home, discussed appropriate way to check BP at home and to monitor and let us know if average is >140/90.  2. Subconjunctival hemorrhage, left: After trauma, no pain or vision changes. Discussed how this looks worse than it is and it will reabsorb in a few days.   Return if symptoms worsen or fail to improve.  Margarita Mail, DO

## 2023-10-20 ENCOUNTER — Encounter: Payer: Self-pay | Admitting: Family Medicine

## 2023-10-20 LAB — CBC WITH DIFFERENTIAL/PLATELET
Absolute Lymphocytes: 2029 {cells}/uL (ref 850–3900)
Absolute Monocytes: 410 {cells}/uL (ref 200–950)
Basophils Absolute: 50 {cells}/uL (ref 0–200)
Basophils Relative: 0.8 %
Eosinophils Absolute: 290 {cells}/uL (ref 15–500)
Eosinophils Relative: 4.6 %
HCT: 42.8 % (ref 35.0–45.0)
Hemoglobin: 14.5 g/dL (ref 11.7–15.5)
MCH: 30.1 pg (ref 27.0–33.0)
MCHC: 33.9 g/dL (ref 32.0–36.0)
MCV: 88.8 fL (ref 80.0–100.0)
MPV: 11.2 fL (ref 7.5–12.5)
Monocytes Relative: 6.5 %
Neutro Abs: 3522 {cells}/uL (ref 1500–7800)
Neutrophils Relative %: 55.9 %
Platelets: 281 10*3/uL (ref 140–400)
RBC: 4.82 10*6/uL (ref 3.80–5.10)
RDW: 11.5 % (ref 11.0–15.0)
Total Lymphocyte: 32.2 %
WBC: 6.3 10*3/uL (ref 3.8–10.8)

## 2023-10-20 LAB — HEMOGLOBIN A1C
Hgb A1c MFr Bld: 4.9 %{Hb} (ref <5.7)
Mean Plasma Glucose: 94 mg/dL
eAG (mmol/L): 5.2 mmol/L

## 2023-10-20 LAB — COMPLETE METABOLIC PANEL WITH GFR
AG Ratio: 1.8 (calc) (ref 1.0–2.5)
ALT: 18 U/L (ref 6–29)
AST: 14 U/L (ref 10–35)
Albumin: 4.6 g/dL (ref 3.6–5.1)
Alkaline phosphatase (APISO): 122 U/L (ref 37–153)
BUN: 11 mg/dL (ref 7–25)
CO2: 27 mmol/L (ref 20–32)
Calcium: 9.7 mg/dL (ref 8.6–10.4)
Chloride: 104 mmol/L (ref 98–110)
Creat: 0.76 mg/dL (ref 0.50–1.03)
Globulin: 2.6 g/dL (ref 1.9–3.7)
Glucose, Bld: 94 mg/dL (ref 65–99)
Potassium: 4.1 mmol/L (ref 3.5–5.3)
Sodium: 139 mmol/L (ref 135–146)
Total Bilirubin: 0.5 mg/dL (ref 0.2–1.2)
Total Protein: 7.2 g/dL (ref 6.1–8.1)
eGFR: 94 mL/min/{1.73_m2} (ref >=60)

## 2023-10-20 LAB — LIPID PANEL
Cholesterol: 229 mg/dL — ABNORMAL HIGH (ref <200)
HDL: 57 mg/dL (ref >=50)
LDL Cholesterol (Calc): 143 mg/dL — ABNORMAL HIGH
Non-HDL Cholesterol (Calc): 172 mg/dL — ABNORMAL HIGH (ref <130)
Total CHOL/HDL Ratio: 4 (calc) (ref <5.0)
Triglycerides: 156 mg/dL — ABNORMAL HIGH (ref <150)

## 2023-10-20 LAB — TSH: TSH: 2.59 m[IU]/L

## 2023-11-04 ENCOUNTER — Encounter: Payer: Self-pay | Admitting: Family Medicine

## 2023-11-05 ENCOUNTER — Encounter: Payer: Self-pay | Admitting: Family Medicine

## 2023-11-05 ENCOUNTER — Other Ambulatory Visit: Payer: Self-pay

## 2023-11-05 ENCOUNTER — Ambulatory Visit (INDEPENDENT_AMBULATORY_CARE_PROVIDER_SITE_OTHER): Payer: Commercial Managed Care - PPO | Admitting: Family Medicine

## 2023-11-05 VITALS — BP 138/82 | HR 92 | Resp 16 | Ht 67.0 in | Wt 165.2 lb

## 2023-11-05 DIAGNOSIS — F338 Other recurrent depressive disorders: Secondary | ICD-10-CM

## 2023-11-05 DIAGNOSIS — I10 Essential (primary) hypertension: Secondary | ICD-10-CM

## 2023-11-05 DIAGNOSIS — N951 Menopausal and female climacteric states: Secondary | ICD-10-CM

## 2023-11-05 MED ORDER — VENLAFAXINE HCL ER 75 MG PO CP24: 75.0000 mg | ORAL_CAPSULE | Freq: Every day | ORAL | 0 refills | Status: DC

## 2023-11-05 MED ORDER — HYDROCHLOROTHIAZIDE 12.5 MG PO TABS: 12.5000 mg | ORAL_TABLET | Freq: Every day | ORAL | 0 refills | Status: DC

## 2023-11-05 NOTE — Patient Instructions (Signed)

## 2023-11-05 NOTE — Progress Notes (Signed)
 Name: Donna Wyatt   MRN: [id number]    DOB: October 27, 1970   Date:11/05/2023       Progress Note  Subjective  Chief Complaint  Chief Complaint  Patient presents with   Hypertension    Pt checks her BP x3 a week readings are 140s-150s and diastolic 80-90s.   Discussed the use of AI scribe software for clinical note transcription with the patient, who gave verbal consent to proceed.  History of Present Illness   Donna Wyatt is a 53 year old female who presents with elevated blood pressure readings.  She has been experiencing elevated blood pressure readings both at home and at work, consistently ranging between 140-150/80-90 mmHg. During her annual visit last month, her blood pressure was noted to be high, prompting her to monitor it regularly. Previously, her blood pressure was normal, with readings such as 128/76 mmHg in September and 134/86 mmHg in July of the previous year, although it has occasionally risen above 140/90 mmHg.  She is currently taking Celebrex  for right foot tendonitis, which she resumed this week due to a recurrence of symptoms. Her blood pressure was high even before resuming Celebrex . She also takes Effexor  for hot flashes and seasonal affective disorder, although she admits to inconsistent use, missing doses occasionally. She has noticed an improvement in her hot flashes, with less severe symptoms, but they are not completely resolved. She has stopped taking Trazodone  for sleep, although it remains on her medication list as needed.  Her diet is described as 'pretty clean,' consisting of salads, leafy foods, and beans, with minimal use of salt and prepackaged foods. She occasionally uses canned beans but primarily relies on frozen vegetables.  Her family history is significant for hypertension, as both her mother and father had the condition.       The 10-year ASCVD risk score (Arnett DK, et al., 2019) is: 3.4%   Values used to calculate the score:      Age: 45 years     Sex: Female     Is Non-Hispanic African American: Yes     Diabetic: No     Tobacco smoker: No     Systolic Blood Pressure: 138 mmHg     Is BP treated: No     HDL Cholesterol: 57 mg/dL     Total Cholesterol: 229 mg/dL    Patient Active Problem List   Diagnosis Date Noted   Elevated BP without diagnosis of hypertension 10/04/2023   Hypercholesteremia 10/04/2023   Hyperglycemia 10/04/2023   Seasonal affective disorder (HCC) 12/06/2020   Diverticulosis 06/18/2015   History of hysterectomy, supracervical 06/18/2015   Breast lump in female 06/17/2015   Chronic constipation 06/17/2015   Cephalalgia 06/17/2015    Past Surgical History:  Procedure Laterality Date   COLONOSCOPY WITH PROPOFOL  N/A 07/21/2019   Procedure: COLONOSCOPY WITH PROPOFOL ;  Surgeon: Therisa Bi, MD;  Location: [facility] ENDOSCOPY;  Service: Gastroenterology;  Laterality: N/A;   LAPAROSCOPIC SUPRACERVICAL HYSTERECTOMY  2008   without oophorectomy    Family History  Problem Relation Age of Onset   Diabetes Mother    Hyperlipidemia Father    Hypertension Father    Glaucoma Father    Hyperlipidemia Sister    Hypertension Sister    Asthma Sister    Fibroids Sister    Breast cancer Neg Hx     Social History   Tobacco Use   Smoking status: Never   Smokeless tobacco: Never  Substance Use Topics   Alcohol use: Yes  Alcohol/week: 0.0 standard drinks of alcohol    Comment: occasionally     Current Outpatient Medications:    EPINEPHrine  0.3 mg/0.3 mL IJ SOAJ injection, Inject 0.3 mg into the muscle as needed for anaphylaxis., Disp: 2 each, Rfl: 0   venlafaxine  XR (EFFEXOR  XR) 75 MG 24 hr capsule, Take 1 capsule (75 mg total) by mouth daily with breakfast., Disp: 90 capsule, Rfl: 0   celecoxib  (CELEBREX ) 100 MG capsule, Take 1 capsule (100 mg total) by mouth 2 (two) times daily. (Patient not taking: Reported on 11/05/2023), Disp: 60 capsule, Rfl: 0   traZODone  (DESYREL ) 50 MG tablet, Take  0.5-1 tablet (25-50 mg total) by mouth at bedtime as needed for sleep. (Patient not taking: Reported on 11/05/2023), Disp: 90 tablet, Rfl: 1  No Known Allergies  I personally reviewed active problem list with the patient/caregiver today.   ROS  Ten systems reviewed and is negative except as mentioned in HPI    Objective  Vitals:   11/05/23 1516  BP: 138/82  Pulse: 92  Resp: 16  SpO2: 99%  Weight: 165 lb 3.2 oz (74.9 kg)  Height: 5' 7 (1.702 m)    Body mass index is 25.87 kg/m.  Physical Exam  Constitutional: Patient appears well-developed and well-nourished. No distress.  HEENT: head atraumatic, normocephalic, pupils equal and reactive to light, neck supple Cardiovascular: Normal rate, regular rhythm and normal heart sounds.  No murmur heard. No BLE edema. Pulmonary/Chest: Effort normal and breath sounds normal. No respiratory distress. Abdominal: Soft.  There is no tenderness. Psychiatric: Patient has a normal mood and affect. behavior is normal. Judgment and thought content normal.   Recent Results (from the past 2160 hours)  Lipid panel     Status: Abnormal   Collection Time: 10/19/23  8:19 AM  Result Value Ref Range   Cholesterol 229 (H) <200 mg/dL   HDL 57 > OR = 50 mg/dL   Triglycerides 843 (H) <150 mg/dL   LDL Cholesterol (Calc) 143 (H) mg/dL (calc)    Comment: Reference range: <100 . Desirable range <100 mg/dL for primary prevention;   <70 mg/dL for patients with CHD or diabetic patients  with > or = 2 CHD risk factors. SABRA LDL-C is now calculated using the Martin-Hopkins  calculation, which is a validated novel method providing  better accuracy than the Friedewald equation in the  estimation of LDL-C.  Gladis APPLETHWAITE et al. SANDREA. 7986;689(80): 2061-2068  (http://education.[facility].com/faq/FAQ164)    Total CHOL/HDL Ratio 4.0 <5.0 (calc)   Non-HDL Cholesterol (Calc) 172 (H) <130 mg/dL (calc)    Comment: For patients with diabetes plus 1 major ASCVD  risk  factor, treating to a non-HDL-C goal of <100 mg/dL  (LDL-C of <29 mg/dL) is considered a therapeutic  option.   CBC with Differential/Platelet     Status: None   Collection Time: 10/19/23  8:19 AM  Result Value Ref Range   WBC 6.3 3.8 - 10.8 Thousand/uL   RBC 4.82 3.80 - 5.10 Million/uL   Hemoglobin 14.5 11.7 - 15.5 g/dL   HCT 57.1 64.9 - 54.9 %   MCV 88.8 80.0 - 100.0 fL   MCH 30.1 27.0 - 33.0 pg   MCHC 33.9 32.0 - 36.0 g/dL    Comment: For adults, a slight decrease in the calculated MCHC value (in the range of 30 to 32 g/dL) is most likely not clinically significant; however, it should be interpreted with caution in correlation with other red cell parameters and the patient's clinical  condition.    RDW 11.5 11.0 - 15.0 %   Platelets 281 140 - 400 Thousand/uL   MPV 11.2 7.5 - 12.5 fL   Neutro Abs 3,522 1,500 - 7,800 cells/uL   Absolute Lymphocytes 2,029 850 - 3,900 cells/uL   Absolute Monocytes 410 200 - 950 cells/uL   Eosinophils Absolute 290 15 - 500 cells/uL   Basophils Absolute 50 0 - 200 cells/uL   Neutrophils Relative % 55.9 %   Total Lymphocyte 32.2 %   Monocytes Relative 6.5 %   Eosinophils Relative 4.6 %   Basophils Relative 0.8 %  COMPLETE METABOLIC PANEL WITH GFR     Status: None   Collection Time: 10/19/23  8:19 AM  Result Value Ref Range   Glucose, Bld 94 65 - 99 mg/dL    Comment: .            Fasting reference interval .    BUN 11 7 - 25 mg/dL   Creat 9.23 9.49 - 8.96 mg/dL   eGFR 94 > OR = 60 fO/fpw/8.26f7   BUN/Creatinine Ratio SEE NOTE: 6 - 22 (calc)    Comment:    Not Reported: BUN and Creatinine are within    reference range. .    Sodium 139 135 - 146 mmol/L   Potassium 4.1 3.5 - 5.3 mmol/L   Chloride 104 98 - 110 mmol/L   CO2 27 20 - 32 mmol/L   Calcium 9.7 8.6 - 10.4 mg/dL   Total Protein 7.2 6.1 - 8.1 g/dL   Albumin 4.6 3.6 - 5.1 g/dL   Globulin 2.6 1.9 - 3.7 g/dL (calc)   AG Ratio 1.8 1.0 - 2.5 (calc)   Total Bilirubin 0.5 0.2  - 1.2 mg/dL   Alkaline phosphatase (APISO) 122 37 - 153 U/L   AST 14 10 - 35 U/L   ALT 18 6 - 29 U/L  TSH     Status: None   Collection Time: 10/19/23  8:19 AM  Result Value Ref Range   TSH 2.59 mIU/L    Comment:           Reference Range .           > or = 20 Years  0.40-4.50 .                Pregnancy Ranges           First trimester    0.26-2.66           Second trimester   0.55-2.73           Third trimester    0.43-2.91   Hemoglobin A1c     Status: None   Collection Time: 10/19/23  8:19 AM  Result Value Ref Range   Hgb A1c MFr Bld 4.9 <5.7 % of total Hgb    Comment: For the purpose of screening for the presence of diabetes: . <5.7%       Consistent with the absence of diabetes 5.7-6.4%    Consistent with increased risk for diabetes             (prediabetes) > or =6.5%  Consistent with diabetes . This assay result is consistent with a decreased risk of diabetes. . Currently, no consensus exists regarding use of hemoglobin A1c for diagnosis of diabetes in children. . According to American Diabetes Association (ADA) guidelines, hemoglobin A1c <7.0% represents optimal control in non-pregnant diabetic patients. Different metrics may apply to specific patient populations.  Standards of Medical  Care in Diabetes(ADA). .    Mean Plasma Glucose 94 mg/dL   eAG (mmol/L) 5.2 mmol/L    Diabetic Foot Exam:     PHQ2/9:    10/04/2023    3:37 PM 04/13/2023    9:52 AM 06/24/2022    1:39 PM 12/06/2020    3:02 PM 07/22/2020    7:52 AM  Depression screen PHQ 2/9  Decreased Interest 0 0 0 0 2  Down, Depressed, Hopeless 0 0 0 0 2  PHQ - 2 Score 0 0 0 0 4  Altered sleeping 0 0 0 2 2  Tired, decreased energy 0 0 0 2 2  Change in appetite 0 0 0 0 0  Feeling bad or failure about yourself  0 0 0 0 2  Trouble concentrating 0 0 0 0 2  Moving slowly or fidgety/restless 0 0 0 0 0  Suicidal thoughts 0 0 0 0 0  PHQ-9 Score 0 0 0 4 12  Difficult doing work/chores Not difficult at  all  Not difficult at all  Somewhat difficult    phq 9 is negative  Fall Risk:    10/04/2023    3:18 PM 06/14/2023    1:47 PM 04/13/2023    9:52 AM 06/24/2022    1:39 PM 12/06/2020    3:02 PM  Fall Risk   Falls in the past year? 0 0 0 0 0  Number falls in past yr: 0  0 0 0  Injury with Fall? 0  0 0 0  Risk for fall due to : No Fall Risks No Fall Risks No Fall Risks    Follow up Falls prevention discussed;Education provided;Falls evaluation completed Falls prevention discussed Falls prevention discussed       Assessment and Plan    Hypertension Newly diagnosed based on multiple readings above 140/90 at home and in the clinic. Discussed the risks of untreated hypertension (heart failure, stroke) and the importance of lifestyle modifications (DASH diet, physical activity, avoiding caffeine). -Start Hydrochlorothiazide  12.5mg  daily in the morning. -Check blood pressure at work and at home. -Follow-up in 3 months.  Seasonal Affective Disorder/Hot Flashes Currently on Effexor  with inconsistent use. Discussed the benefits of Effexor  for both conditions and the importance of consistent use. -Continue Effexor  as prescribed. -Refill Effexor  prescription.  Insomnia Trazodone  use as needed for sleep. -Continue Trazodone  as needed.  Hyperlipidemia Slightly elevated cholesterol levels. -Discussed lifestyle modifications (DASH diet, physical activity). ASCVD risk is still low and we will monitor   General Health Maintenance -Order EKG for baseline comparison with previous EKG from 2011. It was unchanged -Follow-up appointment in 3 months.

## 2024-02-07 ENCOUNTER — Other Ambulatory Visit: Payer: Self-pay

## 2024-02-07 ENCOUNTER — Encounter: Payer: Self-pay | Admitting: Family Medicine

## 2024-02-07 ENCOUNTER — Ambulatory Visit (INDEPENDENT_AMBULATORY_CARE_PROVIDER_SITE_OTHER): Payer: Commercial Managed Care - PPO | Admitting: Family Medicine

## 2024-02-07 VITALS — BP 114/78 | HR 98 | Resp 16 | Ht 67.0 in | Wt 163.5 lb

## 2024-02-07 DIAGNOSIS — N951 Menopausal and female climacteric states: Secondary | ICD-10-CM | POA: Diagnosis not present

## 2024-02-07 DIAGNOSIS — M25571 Pain in right ankle and joints of right foot: Secondary | ICD-10-CM | POA: Diagnosis not present

## 2024-02-07 DIAGNOSIS — I10 Essential (primary) hypertension: Secondary | ICD-10-CM | POA: Diagnosis not present

## 2024-02-07 DIAGNOSIS — G8929 Other chronic pain: Secondary | ICD-10-CM | POA: Diagnosis not present

## 2024-02-07 DIAGNOSIS — F338 Other recurrent depressive disorders: Secondary | ICD-10-CM | POA: Diagnosis not present

## 2024-02-07 DIAGNOSIS — Z91018 Allergy to other foods: Secondary | ICD-10-CM | POA: Diagnosis not present

## 2024-02-07 DIAGNOSIS — E7800 Pure hypercholesterolemia, unspecified: Secondary | ICD-10-CM

## 2024-02-07 MED ORDER — HYDROCHLOROTHIAZIDE 12.5 MG PO TABS
12.5000 mg | ORAL_TABLET | Freq: Every day | ORAL | 1 refills | Status: AC
  Filled 2024-02-07 (×2): qty 90, 90d supply, fill #0
  Filled 2024-07-12 – 2024-07-28 (×2): qty 90, 90d supply, fill #1

## 2024-02-07 MED ORDER — VENLAFAXINE HCL ER 75 MG PO CP24
75.0000 mg | ORAL_CAPSULE | Freq: Every day | ORAL | 1 refills | Status: AC
  Filled 2024-02-07 (×2): qty 90, 90d supply, fill #0
  Filled 2024-07-12 – 2024-07-28 (×2): qty 90, 90d supply, fill #1

## 2024-02-07 NOTE — Progress Notes (Signed)
 Name: Donna Wyatt   MRN: [id number]    DOB: Aug 23, 1971   Date:02/07/2024       Progress Note  Subjective  Chief Complaint  Chief Complaint  Patient presents with   Medical Management of Chronic Issues   Discussed the use of AI scribe software for clinical note transcription with the patient, who gave verbal consent to proceed.  History of Present Illness Donna Wyatt "Donna Wyatt" is a 53 year old female with hypertension who presents for a three-month follow-up to monitor blood pressure control.  She was started on hydrochlorothiazide  (HCTZ) 12.5 mg once daily for elevated blood pressure. Her home and work blood pressure readings are consistently around 120/70 mmHg, with a recent reading of 114/78 mmHg. No side effects such as headaches, chest pain, or palpitations. She follows a low-salt diet and acknowledges the need to increase physical activity. She consumes decaffeinated coffee three times a week.  She has a history of seasonal affective disorder and was started on venlafaxine  (Effexor ) for hot flashes, which were not controlled by Lexapro . She is currently on 75 mg of venlafaxine  daily, which has significantly improved her hot flashes. She started with 37.5 mg and increased to 75 mg. She describes the hot flashes as 'much better than it was'.  She reports chronic ankle pain that began in fall 2024. She was prescribed Celebrex , which improved but did not resolve the issue. The pain occurs when she first starts walking, with no known trauma. She has not seen a podiatrist or had an x-ray. She recalls a previous incident of knee pain that was later diagnosed as a fracture without known cause.  She has a history of high cholesterol, which she is managing with lifestyle modifications.   She carries an Epipen  for food allergies.   She no longer takes trazodone , as it did not improve her sleep, which has been better since her hot flashes improved with Effexor   No headaches, chest pain, or  palpitations. Improved sleep since her hot flashes have been managed.    Patient Active Problem List   Diagnosis Date Noted   Elevated BP without diagnosis of hypertension 10/04/2023   Hypercholesteremia 10/04/2023   Hyperglycemia 10/04/2023   Seasonal affective disorder (HCC) 12/06/2020   Diverticulosis 06/18/2015   History of hysterectomy, supracervical 06/18/2015   Breast lump in female 06/17/2015   Chronic constipation 06/17/2015   Cephalalgia 06/17/2015    Past Surgical History:  Procedure Laterality Date   COLONOSCOPY WITH PROPOFOL  N/A 07/21/2019   Procedure: COLONOSCOPY WITH PROPOFOL ;  Surgeon: Luke Salaam, MD;  Location[facility] ENDOSCOPY;  Service: Gastroenterology;  Laterality: N/A;   LAPAROSCOPIC SUPRACERVICAL HYSTERECTOMY  2008   without oophorectomy    Family History  Problem Relation Age of Onset   Diabetes Mother    Hyperlipidemia Father    Hypertension Father    Glaucoma Father    Hyperlipidemia Sister    Hypertension Sister    Asthma Sister    Fibroids Sister    Breast cancer Neg Hx     Social History   Tobacco Use   Smoking status: Never   Smokeless tobacco: Never  Substance Use Topics   Alcohol use: Yes    Alcohol/week: 0.0 standard drinks of alcohol    Comment: occasionally     Current Outpatient Medications:    EPINEPHrine  0.3 mg/0.3 mL IJ SOAJ injection, Inject 0.3 mg into the muscle as needed for anaphylaxis., Disp: 2 each, Rfl: 0   hydrochlorothiazide  (HYDRODIURIL ) 12.5 MG tablet, Take 1 tablet (  12.5 mg total) by mouth daily., Disp: 90 tablet, Rfl: 0   venlafaxine  XR (EFFEXOR  XR) 75 MG 24 hr capsule, Take 1 capsule (75 mg total) by mouth daily with breakfast., Disp: 90 capsule, Rfl: 0   celecoxib  (CELEBREX ) 100 MG capsule, Take 1 capsule (100 mg total) by mouth 2 (two) times daily. (Patient not taking: Reported on 10/19/2023), Disp: 60 capsule, Rfl: 0   traZODone  (DESYREL ) 50 MG tablet, Take 0.5-1 tablet (25-50 mg total) by mouth at bedtime as  needed for sleep. (Patient not taking: Reported on 10/19/2023), Disp: 90 tablet, Rfl: 1  No Known Allergies  I personally reviewed active problem list, medication list, allergies with the patient/caregiver today.   ROS  Ten systems reviewed and is negative except as mentioned in HPI    Objective Physical Exam CONSTITUTIONAL: Patient appears well-developed and well-nourished. No distress. HEENT: Head atraumatic, normocephalic, neck supple. Cerumen in right ear. CARDIOVASCULAR: Normal rate, regular rhythm and normal heart sounds. No murmur heard. No BLE edema. PULMONARY: Effort normal and breath sounds normal. Lungs clear to auscultation. No respiratory distress. ABDOMINAL: There is no tenderness or distention. MUSCULOSKELETAL: Normal gait. Decrease dorsiflexion right foot , no redness, increase in warmth or edema, could not localize pain with palpation  PSYCHIATRIC: Patient has a normal mood and affect. Behavior is normal. Judgment and thought content normal.  Vitals:   02/07/24 1516  BP: 114/78  Pulse: 98  Resp: 16  SpO2: 97%  Weight: 163 lb 8 oz (74.2 kg)  Height: 5\' 7"  (1.702 m)    Body mass index is 25.61 kg/m.    PHQ2/9:    02/07/2024    3:10 PM 10/04/2023    3:37 PM 04/13/2023    9:52 AM 06/24/2022    1:39 PM 12/06/2020    3:02 PM  Depression screen PHQ 2/9  Decreased Interest 0 0 0 0 0  Down, Depressed, Hopeless 0 0 0 0 0  PHQ - 2 Score 0 0 0 0 0  Altered sleeping  0 0 0 2  Tired, decreased energy  0 0 0 2  Change in appetite  0 0 0 0  Feeling bad or failure about yourself   0 0 0 0  Trouble concentrating  0 0 0 0  Moving slowly or fidgety/restless  0 0 0 0  Suicidal thoughts  0 0 0 0  PHQ-9 Score  0 0 0 4  Difficult doing work/chores  Not difficult at all  Not difficult at all     phq 9 is negative  Fall Risk:    02/07/2024    3:10 PM 10/04/2023    3:18 PM 06/14/2023    1:47 PM 04/13/2023    9:52 AM 06/24/2022    1:39 PM  Fall Risk   Falls in the past  year? 0 0 0 0 0  Number falls in past yr: 0 0  0 0  Injury with Fall? 0 0  0 0  Risk for fall due to : No Fall Risks No Fall Risks No Fall Risks No Fall Risks   Follow up Falls prevention discussed;Education provided;Falls evaluation completed Falls prevention discussed;Education provided;Falls evaluation completed Falls prevention discussed Falls prevention discussed      Assessment & Plan Chronic ankle pain Chronic ankle pain, no trauma,  differential includes sprain /STrain or structural issues.  - Refer to podiatrist for evaluation and x-rays. - Consider physical therapy based on podiatrist's recommendations.  Hypertension Hypertension well-controlled with hydrochlorothiazide  12.5 mg daily.  Blood pressure stable at 120/70 mmHg. No side effects reported. - Continue hydrochlorothiazide  12.5 mg daily. - Refill prescription for hydrochlorothiazide  for 6 months.  Hyperlipidemia Hyperlipidemia managed with lifestyle modifications. Cholesterol levels slightly elevated. - Continue lifestyle modifications for cholesterol management.  Seasonal Affective Disorder/Hot flashes Seasonal Affective Disorder managed with venlafaxine  75 mg daily, also alleviating hot flashes. Symptoms well-controlled. - Continue venlafaxine  75 mg daily. - Plan to increase venlafaxine  to 150 mg in the fall due to seasonal affective disorder - Schedule follow-up in late October or early November to adjust medication.

## 2024-02-08 ENCOUNTER — Other Ambulatory Visit: Payer: Self-pay

## 2024-02-08 ENCOUNTER — Other Ambulatory Visit (HOSPITAL_COMMUNITY): Payer: Self-pay

## 2024-02-16 ENCOUNTER — Ambulatory Visit
Admission: RE | Admit: 2024-02-16 | Discharge: 2024-02-16 | Disposition: A | Discharge status: Home / Self Care | Payer: Self-pay | Source: Ambulatory Visit | Attending: Family Medicine | Admitting: Family Medicine

## 2024-02-16 DIAGNOSIS — Z1231 Encounter for screening mammogram for malignant neoplasm of breast: Secondary | ICD-10-CM | POA: Diagnosis not present

## 2024-02-16 DIAGNOSIS — Z0000 Encounter for general adult medical examination without abnormal findings: Secondary | ICD-10-CM

## 2024-07-25 ENCOUNTER — Other Ambulatory Visit: Payer: Self-pay

## 2024-07-28 ENCOUNTER — Ambulatory Visit: Admitting: Family Medicine

## 2024-07-28 ENCOUNTER — Other Ambulatory Visit: Payer: Self-pay

## 2024-10-03 NOTE — Patient Instructions (Signed)
 Preventive Care 54-54 Years Old, Female  Preventive care refers to lifestyle choices and visits with your health care provider that can promote health and wellness. Preventive care visits are also called wellness exams.  What can I expect for my preventive care visit?  Counseling  Your health care provider may ask you questions about your:  Medical history, including:  Past medical problems.  Family medical history.  Pregnancy history.  Current health, including:  Menstrual cycle.  Method of birth control.  Emotional well-being.  Home life and relationship well-being.  Sexual activity and sexual health.  Lifestyle, including:  Alcohol, nicotine or tobacco, and drug use.  Access to firearms.  Diet, exercise, and sleep habits.  Work and work Astronomer.  Sunscreen use.  Safety issues such as seatbelt and bike helmet use.  Physical exam  Your health care provider will check your:  Height and weight. These may be used to calculate your BMI (body mass index). BMI is a measurement that tells if you are at a healthy weight.  Waist circumference. This measures the distance around your waistline. This measurement also tells if you are at a healthy weight and may help predict your risk of certain diseases, such as type 2 diabetes and high blood pressure.  Heart rate and blood pressure.  Body temperature.  Skin for abnormal spots.  What immunizations do I need?    Vaccines are usually given at various ages, according to a schedule. Your health care provider will recommend vaccines for you based on your age, medical history, and lifestyle or other factors, such as travel or where you work.  What tests do I need?  Screening  Your health care provider may recommend screening tests for certain conditions. This may include:  Lipid and cholesterol levels.  Diabetes screening. This is done by checking your blood sugar (glucose) after you have not eaten for a while (fasting).  Pelvic exam and Pap test.  Hepatitis B test.  Hepatitis C  test.  HIV (human immunodeficiency virus) test.  STI (sexually transmitted infection) testing, if you are at risk.  Lung cancer screening.  Colorectal cancer screening.  Mammogram. Talk with your health care provider about when you should start having regular mammograms. This may depend on whether you have a family history of breast cancer.  BRCA-related cancer screening. This may be done if you have a family history of breast, ovarian, tubal, or peritoneal cancers.  Bone density scan. This is done to screen for osteoporosis.  Talk with your health care provider about your test results, treatment options, and if necessary, the need for more tests.  Follow these instructions at home:  Eating and drinking    Eat a diet that includes fresh fruits and vegetables, whole grains, lean protein, and low-fat dairy products.  Take vitamin and mineral supplements as recommended by your health care provider.  Do not drink alcohol if:  Your health care provider tells you not to drink.  You are pregnant, may be pregnant, or are planning to become pregnant.  If you drink alcohol:  Limit how much you have to 0-1 drink a day.  Know how much alcohol is in your drink. In the [place] one drink equals one 12 oz bottle of beer (355 mL), one 5 oz glass of wine (148 mL), or one 1 oz glass of hard liquor (44 mL).  Lifestyle  Brush your teeth every morning and night with fluoride toothpaste. Floss one time each day.  Exercise for at least  30 minutes 5 or more days each week.  Do not use any products that contain nicotine or tobacco. These products include cigarettes, chewing tobacco, and vaping devices, such as e-cigarettes. If you need help quitting, ask your health care provider.  Do not use drugs.  If you are sexually active, practice safe sex. Use a condom or other form of protection to prevent STIs.  If you do not wish to become pregnant, use a form of birth control. If you plan to become pregnant, see your health care provider for a  prepregnancy visit.  Take aspirin only as told by your health care provider. Make sure that you understand how much to take and what form to take. Work with your health care provider to find out whether it is safe and beneficial for you to take aspirin daily.  Find healthy ways to manage stress, such as:  Meditation, yoga, or listening to music.  Journaling.  Talking to a trusted person.  Spending time with friends and family.  Minimize exposure to UV radiation to reduce your risk of skin cancer.  Safety  Always wear your seat belt while driving or riding in a vehicle.  Do not drive:  If you have been drinking alcohol. Do not ride with someone who has been drinking.  When you are tired or distracted.  While texting.  If you have been using any mind-altering substances or drugs.  Wear a helmet and other protective equipment during sports activities.  If you have firearms in your house, make sure you follow all gun safety procedures.  Seek help if you have been physically or sexually abused.  What's next?  Visit your health care provider once a year for an annual wellness visit.  Ask your health care provider how often you should have your eyes and teeth checked.  Stay up to date on all vaccines.  This information is not intended to replace advice given to you by your health care provider. Make sure you discuss any questions you have with your health care provider.  Document Revised: 03/12/2021 Document Reviewed: 03/12/2021  Elsevier Patient Education  2024 [employer].

## 2024-10-04 ENCOUNTER — Ambulatory Visit: Payer: Self-pay | Admitting: Family Medicine

## 2024-10-04 ENCOUNTER — Encounter: Payer: Self-pay | Admitting: Family Medicine

## 2024-10-04 VITALS — BP 114/66 | HR 73 | Resp 16 | Ht 67.0 in | Wt 166.3 lb

## 2024-10-04 DIAGNOSIS — Z1231 Encounter for screening mammogram for malignant neoplasm of breast: Secondary | ICD-10-CM

## 2024-10-04 DIAGNOSIS — F338 Other recurrent depressive disorders: Secondary | ICD-10-CM

## 2024-10-04 DIAGNOSIS — R739 Hyperglycemia, unspecified: Secondary | ICD-10-CM

## 2024-10-04 DIAGNOSIS — M8589 Other specified disorders of bone density and structure, multiple sites: Secondary | ICD-10-CM

## 2024-10-04 DIAGNOSIS — E7800 Pure hypercholesterolemia, unspecified: Secondary | ICD-10-CM

## 2024-10-04 DIAGNOSIS — Z0000 Encounter for general adult medical examination without abnormal findings: Secondary | ICD-10-CM | POA: Diagnosis not present

## 2024-10-04 DIAGNOSIS — I10 Essential (primary) hypertension: Secondary | ICD-10-CM

## 2024-10-04 NOTE — Progress Notes (Signed)
 Name: Donna Wyatt   MRN: [id number]    DOB: 1971/03/04   Date:10/04/2024       Progress Note  Subjective  Chief Complaint  Chief Complaint  Patient presents with   Annual Exam    HPI  Patient presents for annual CPE.  Diet: eats mostly at home Exercise:  she has not been walking as often lately, states due to seasonal affective disorder  Last Eye Exam: completed Last Dental Exam: completed  Flowsheet Row Office Visit from 10/04/2024 in [facility]  AUDIT-C Score 1   Depression: Phq 9 is  negative    10/04/2024    2:48 PM 02/07/2024    3:10 PM 10/04/2023    3:37 PM 04/13/2023    9:52 AM 06/24/2022    1:39 PM  Depression screen PHQ 2/9  Decreased Interest 0 0 0 0 0  Down, Depressed, Hopeless 0 0 0 0 0  PHQ - 2 Score 0 0 0 0 0  Altered sleeping   0 0 0  Tired, decreased energy   0 0 0  Change in appetite   0 0 0  Feeling bad or failure about yourself    0 0 0  Trouble concentrating   0 0 0  Moving slowly or fidgety/restless   0 0 0  Suicidal thoughts   0 0 0  PHQ-9 Score   0  0  0   Difficult doing work/chores   Not difficult at all  Not difficult at all     Data saved with a previous flowsheet row definition   Hypertension: BP Readings from Last 3 Encounters:  10/04/24 114/66  02/07/24 114/78  11/05/23 138/82   Obesity: Wt Readings from Last 3 Encounters:  10/04/24 166 lb 4.8 oz (75.4 kg)  02/07/24 163 lb 8 oz (74.2 kg)  11/05/23 165 lb 3.2 oz (74.9 kg)   BMI Readings from Last 3 Encounters:  10/04/24 26.05 kg/m  02/07/24 25.61 kg/m  11/05/23 25.87 kg/m     Vaccines: reviewed with the patient.   Hep C Screening: completed STD testing and prevention (HIV/chl/gon/syphilis): not interested  Intimate partner violence: negative screen  Sexual History : supra cervical hysterectomy  Menstrual History/LMP/Abnormal Bleeding: N/A Discussed importance of follow up if any post-menopausal bleeding: yes  Incontinence Symptoms: negative  for symptoms   Breast cancer:  - Last Mammogram: she will schedule  - BRCA gene screening: N/A  Osteoporosis Prevention : Discussed high calcium and vitamin D  supplementation, weight bearing exercises Bone density :yes , reviewed last one and we will recheck in 3 more years   Cervical cancer screening: up-to-date  Skin cancer: Discussed monitoring for atypical lesions  Colorectal cancer: 2030   Lung cancer:  Low Dose CT Chest recommended if Age 57-80 years, 20 pack-year currently smoking OR have quit w/in 15years. Patient does not qualify for screen   ECG: 2025  Advanced Care Planning: A voluntary discussion about advance care planning including the explanation and discussion of advance directives.  Discussed health care proxy and Living will, and the patient was able to identify a health care proxy as husband .  Patient does have a living will and power of attorney of health care   Patient Active Problem List   Diagnosis Date Noted   Chronic pain of right ankle 02/07/2024   Hot flashes due to menopause 02/07/2024   Hypertension, benign 02/07/2024   Food allergy  02/07/2024   Elevated BP without diagnosis of hypertension 10/04/2023  Hypercholesteremia 10/04/2023   Hyperglycemia 10/04/2023   Seasonal affective disorder 12/06/2020   Diverticulosis 06/18/2015   History of hysterectomy, supracervical 06/18/2015   Breast lump in female 06/17/2015   Chronic constipation 06/17/2015   Cephalalgia 06/17/2015    Past Surgical History:  Procedure Laterality Date   COLONOSCOPY WITH PROPOFOL  N/A 07/21/2019   Procedure: COLONOSCOPY WITH PROPOFOL ;  Surgeon: Therisa Bi, MD;  Location: [facility] ENDOSCOPY;  Service: Gastroenterology;  Laterality: N/A;   LAPAROSCOPIC SUPRACERVICAL HYSTERECTOMY  2008   without oophorectomy    Family History  Problem Relation Age of Onset   Diabetes Mother    Hyperlipidemia Father    Hypertension Father    Glaucoma Father    Hyperlipidemia Sister     Hypertension Sister    Asthma Sister    Fibroids Sister    Breast cancer Neg Hx     Social History   Socioeconomic History   Marital status: Married    Spouse name: Prentice   Number of children: 2   Years of education: Not on file   Highest education level: Associate degree: academic program  Occupational History   Not on file  Tobacco Use   Smoking status: Never   Smokeless tobacco: Never  Vaping Use   Vaping status: Never Used  Substance and Sexual Activity   Alcohol use: Yes    Alcohol/week: 0.0 standard drinks of alcohol    Comment: occasionally   Drug use: No   Sexual activity: Yes    Partners: Male    Birth control/protection: Surgical  Other Topics Concern   Not on file  Social History Narrative   Not on file   Social Drivers of Health   Tobacco Use: Low Risk (10/04/2024)   Patient History    Smoking Tobacco Use: Never    Smokeless Tobacco Use: Never    Passive Exposure: Not on file  Financial Resource Strain: Low Risk (10/04/2024)   Overall Financial Resource Strain (CARDIA)    Difficulty of Paying Living Expenses: Not hard at all  Food Insecurity: No Food Insecurity (10/04/2024)   Epic    Worried About Radiation Protection Practitioner of Food in the Last Year: Never true    Ran Out of Food in the Last Year: Never true  Transportation Needs: No Transportation Needs (10/04/2024)   Epic    Lack of Transportation (Medical): No    Lack of Transportation (Non-Medical): No  Physical Activity: Inactive (10/04/2024)   Exercise Vital Sign    Days of Exercise per Week: 0 days    Minutes of Exercise per Session: 0 min  Stress: Stress Concern Present (10/04/2024)   [employer] of Occupational Health - Occupational Stress Questionnaire    Feeling of Stress: To some extent  Social Connections: Moderately Integrated (10/04/2024)   Social Connection and Isolation Panel    Frequency of Communication with Friends and Family: More than three times a week    Frequency of Social Gatherings with  Friends and Family: More than three times a week    Attends Religious Services: More than 4 times per year    Active Member of [employer] or Organizations: No    Attends Banker Meetings: Never    Marital Status: Married  Catering Manager Violence: Not At Risk (10/04/2024)   Epic    Fear of Current or Ex-Partner: No    Emotionally Abused: No    Physically Abused: No    Sexually Abused: No  Depression (PHQ2-9): Low Risk (10/04/2024)   Depression (PHQ2-9)  PHQ-2 Score: 0  Alcohol Screen: Low Risk (10/04/2024)   Alcohol Screen    Last Alcohol Screening Score (AUDIT): 1  Housing: Unknown (10/04/2024)   Epic    Unable to Pay for Housing in the Last Year: No    Number of Times Moved in the Last Year: Not on file    Homeless in the Last Year: No  Utilities: Not At Risk (10/04/2024)   Epic    Threatened with loss of utilities: No  Health Literacy: Adequate Health Literacy (10/04/2024)   B1300 Health Literacy    Frequency of need for help with medical instructions: Never    Current Medications[1]  Allergies[2]   ROS  Constitutional: Negative for fever or weight change.  Respiratory: Negative for cough and shortness of breath.   Cardiovascular: Negative for chest pain or palpitations.  Gastrointestinal: Negative for abdominal pain, no bowel changes.  Musculoskeletal: Negative for gait problem or joint swelling.  Skin: Negative for rash.  Neurological: Negative for dizziness or headache.  No other specific complaints in a complete review of systems (except as listed in HPI above).   Objective  Vitals:   10/04/24 1453  BP: 114/66  Pulse: 73  Resp: 16  SpO2: 95%  Weight: 166 lb 4.8 oz (75.4 kg)  Height: 5' 7 (1.702 m)    Body mass index is 26.05 kg/m.  Physical Exam  Constitutional: Patient appears well-developed and well-nourished. No distress.  HENT: Head: Normocephalic and atraumatic. Ears: B TMs ok, no erythema or effusion; Nose: Nose normal. Mouth/Throat:  Oropharynx is clear and moist. No oropharyngeal exudate.  Eyes: Conjunctivae and EOM are normal. Pupils are equal, round, and reactive to light. No scleral icterus.  Neck: Normal range of motion. Neck supple. No JVD present. No thyromegaly present.  Cardiovascular: Normal rate, regular rhythm and normal heart sounds.  No murmur heard. No BLE edema. Pulmonary/Chest: Effort normal and breath sounds normal. No respiratory distress. Abdominal: Soft. Bowel sounds are normal, no distension. There is no tenderness. no masses Breast: no lumps or masses, no nipple discharge or rashes FEMALE GENITALIA:  Not done  RECTAL: not done  Musculoskeletal: Normal range of motion, no joint effusions. No gross deformities Neurological: he is alert and oriented to person, place, and time. No cranial nerve deficit. Coordination, balance, strength, speech and gait are normal.  Skin: Skin is warm and dry. No rash noted. No erythema.  Psychiatric: Patient has a normal mood and affect. behavior is normal. Judgment and thought content normal.     Assessment & Plan   1. Well adult exam (Primary)  - MM 3D SCREENING MAMMOGRAM BILATERAL BREAST; Future - Lipid panel - CBC with Differential/Platelet - Comprehensive metabolic panel with GFR - Hemoglobin A1c  2. Encounter for screening mammogram for malignant neoplasm of breast  - MM 3D SCREENING MAMMOGRAM BILATERAL BREAST; Future  3. Hypercholesteremia  - Lipid panel  4. Hypertension, benign  - CBC with Differential/Platelet - Comprehensive metabolic panel with GFR  5. Seasonal affective disorder  Discussed we can adjust dose of effexor  on her next visit   6. Hyperglycemia  - Hemoglobin A1c  7. Osteopenia of multiple sites  - VITAMIN D  25 Hydroxy (Vit-D Deficiency, Fractures)   -USPSTF grade A and B recommendations reviewed with patient; age-appropriate recommendations, preventive care, screening tests, etc discussed and encouraged; healthy living  encouraged; see AVS for patient education given to patient -Discussed importance of 150 minutes of physical activity weekly, eat two servings of fish weekly, eat one  serving of tree nuts ( cashews, pistachios, pecans, almonds.SABRA) every other day, eat 6 servings of fruit/vegetables daily and drink plenty of water and avoid sweet beverages.   -Reviewed Health Maintenance: Yes.      [1]  Current Outpatient Medications:    EPINEPHrine  0.3 mg/0.3 mL IJ SOAJ injection, Inject 0.3 mg into the muscle as needed for anaphylaxis., Disp: 2 each, Rfl: 0   hydrochlorothiazide  (HYDRODIURIL ) 12.5 MG tablet, Take 1 tablet (12.5 mg total) by mouth daily., Disp: 90 tablet, Rfl: 1   venlafaxine  XR (EFFEXOR  XR) 75 MG 24 hr capsule, Take 1 capsule (75 mg total) by mouth daily with breakfast., Disp: 90 capsule, Rfl: 1   celecoxib  (CELEBREX ) 100 MG capsule, Take 1 capsule (100 mg total) by mouth 2 (two) times daily. (Patient not taking: Reported on 10/04/2024), Disp: 60 capsule, Rfl: 0 [2] No Known Allergies

## 2025-01-02 ENCOUNTER — Ambulatory Visit: Admitting: Family Medicine

## 2025-10-08 ENCOUNTER — Encounter: Admitting: Family Medicine
